# Patient Record
Sex: Female | Born: 1990 | Race: Black or African American | Hispanic: No | Marital: Single | Smoking: Never smoker
Health system: Southern US, Community
[De-identification: ages and names within clinical notes are randomized; demographics above are authoritative.]

## PROBLEM LIST (undated history)

## (undated) ENCOUNTER — Inpatient Hospital Stay (HOSPITAL_COMMUNITY): Payer: Self-pay

## (undated) DIAGNOSIS — F419 Anxiety disorder, unspecified: Secondary | ICD-10-CM

## (undated) DIAGNOSIS — I10 Essential (primary) hypertension: Secondary | ICD-10-CM

## (undated) DIAGNOSIS — O24419 Gestational diabetes mellitus in pregnancy, unspecified control: Secondary | ICD-10-CM

## (undated) DIAGNOSIS — O139 Gestational [pregnancy-induced] hypertension without significant proteinuria, unspecified trimester: Secondary | ICD-10-CM

---
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
## 2017-05-05 ENCOUNTER — Ambulatory Visit: Payer: Self-pay

## 2017-05-05 ENCOUNTER — Ambulatory Visit (INDEPENDENT_AMBULATORY_CARE_PROVIDER_SITE_OTHER): Payer: Self-pay | Admitting: General Practice

## 2017-05-05 ENCOUNTER — Encounter: Payer: Self-pay | Admitting: General Practice

## 2017-05-05 DIAGNOSIS — Z3491 Encounter for supervision of normal pregnancy, unspecified, first trimester: Secondary | ICD-10-CM

## 2017-05-05 DIAGNOSIS — O3680X0 Pregnancy with inconclusive fetal viability, not applicable or unspecified: Secondary | ICD-10-CM

## 2017-05-05 LAB — POCT PREGNANCY, URINE: PREG TEST UR: POSITIVE — AB

## 2017-05-05 NOTE — Progress Notes (Signed)
Patient here for upt today. UPT +. Patient reports first positive home test 3 days ago. LMP sometime end of September but unsure of date. Will do ultrasound to confirm dating/viability. Patient denies taking any meds or vitamins.  Single living IUP. Yolk sac visible CRL 2.36cm (2071w0d) EDD 12/08/2017. FHR 175bpm by m-mode Discussed dating with patient & encouraged patient to begin PNV & start OB care. Patient verbalized understanding.

## 2017-05-06 NOTE — Progress Notes (Signed)
Agree with nursing staff's documentation of this patient's clinic encounter.  Jaynie CollinsUgonna Anjana Cheek, MD 05/06/2017 4:19 PM

## 2017-07-01 ENCOUNTER — Other Ambulatory Visit (HOSPITAL_COMMUNITY)
Admission: RE | Admit: 2017-07-01 | Discharge: 2017-07-01 | Disposition: A | Discharge status: Home / Self Care | Payer: Medicaid Other | Source: Ambulatory Visit | Attending: Medical | Admitting: Medical

## 2017-07-01 ENCOUNTER — Ambulatory Visit (INDEPENDENT_AMBULATORY_CARE_PROVIDER_SITE_OTHER): Payer: Medicaid Other | Admitting: Medical

## 2017-07-01 ENCOUNTER — Encounter: Payer: Self-pay | Admitting: Medical

## 2017-07-01 VITALS — BP 157/100 | HR 90 | Ht 65.0 in | Wt 173.4 lb

## 2017-07-01 DIAGNOSIS — O0992 Supervision of high risk pregnancy, unspecified, second trimester: Secondary | ICD-10-CM

## 2017-07-01 DIAGNOSIS — Z3400 Encounter for supervision of normal first pregnancy, unspecified trimester: Secondary | ICD-10-CM | POA: Insufficient documentation

## 2017-07-01 DIAGNOSIS — O0991 Supervision of high risk pregnancy, unspecified, first trimester: Secondary | ICD-10-CM

## 2017-07-01 DIAGNOSIS — O10912 Unspecified pre-existing hypertension complicating pregnancy, second trimester: Secondary | ICD-10-CM

## 2017-07-01 DIAGNOSIS — Z3402 Encounter for supervision of normal first pregnancy, second trimester: Secondary | ICD-10-CM

## 2017-07-01 DIAGNOSIS — O10919 Unspecified pre-existing hypertension complicating pregnancy, unspecified trimester: Secondary | ICD-10-CM | POA: Insufficient documentation

## 2017-07-01 DIAGNOSIS — O0990 Supervision of high risk pregnancy, unspecified, unspecified trimester: Secondary | ICD-10-CM | POA: Insufficient documentation

## 2017-07-01 LAB — OB RESULTS CONSOLE GC/CHLAMYDIA: Gonorrhea: NEGATIVE

## 2017-07-01 LAB — POCT URINALYSIS DIP (DEVICE)
BILIRUBIN URINE: NEGATIVE
Glucose, UA: NEGATIVE mg/dL
HGB URINE DIPSTICK: NEGATIVE
KETONES UR: NEGATIVE mg/dL
Leukocytes, UA: NEGATIVE
Nitrite: NEGATIVE
PH: 8.5 — AB (ref 5.0–8.0)
PROTEIN: NEGATIVE mg/dL
SPECIFIC GRAVITY, URINE: 1.025 (ref 1.005–1.030)
Urobilinogen, UA: 0.2 mg/dL (ref 0.0–1.0)

## 2017-07-01 MED ORDER — LABETALOL HCL 200 MG PO TABS: 200.0000 mg | ORAL_TABLET | Freq: Two times a day (BID) | ORAL | 3 refills | Status: DC

## 2017-07-01 NOTE — Patient Instructions (Addendum)
Second Trimester of Pregnancy The second trimester is from week 13 through week 28, month 4 through 6. This is often the time in pregnancy that you feel your best. Often times, morning sickness has lessened or quit. You may have more energy, and you may get hungry more often. Your unborn baby (fetus) is growing rapidly. At the end of the sixth month, he or she is about 9 inches long and weighs about 1 pounds. You will likely feel the baby move (quickening) between 18 and 20 weeks of pregnancy. Follow these instructions at home:  Avoid all smoking, herbs, and alcohol. Avoid drugs not approved by your doctor.  Do not use any tobacco products, including cigarettes, chewing tobacco, and electronic cigarettes. If you need help quitting, ask your doctor. You may get counseling or other support to help you quit.  Only take medicine as told by your doctor. Some medicines are safe and some are not during pregnancy.  Exercise only as told by your doctor. Stop exercising if you start having cramps.  Eat regular, healthy meals.  Wear a good support bra if your breasts are tender.  Do not use hot tubs, steam rooms, or saunas.  Wear your seat belt when driving.  Avoid raw meat, uncooked cheese, and liter boxes and soil used by cats.  Take your prenatal vitamins.  Take 1500-2000 milligrams of calcium daily starting at the 20th week of pregnancy until you deliver your baby.  Try taking medicine that helps you poop (stool softener) as needed, and if your doctor approves. Eat more fiber by eating fresh fruit, vegetables, and whole grains. Drink enough fluids to keep your pee (urine) clear or pale yellow.  Take warm water baths (sitz baths) to soothe pain or discomfort caused by hemorrhoids. Use hemorrhoid cream if your doctor approves.  If you have puffy, bulging veins (varicose veins), wear support hose. Raise (elevate) your feet for 15 minutes, 3-4 times a day. Limit salt in your diet.  Avoid heavy  lifting, wear low heals, and sit up straight.  Rest with your legs raised if you have leg cramps or low back pain.  Visit your dentist if you have not gone during your pregnancy. Use a soft toothbrush to brush your teeth. Be gentle when you floss.  You can have sex (intercourse) unless your doctor tells you not to.  Go to your doctor visits. Get help if:  You feel dizzy.  You have mild cramps or pressure in your lower belly (abdomen).  You have a nagging pain in your belly area.  You continue to feel sick to your stomach (nauseous), throw up (vomit), or have watery poop (diarrhea).  You have bad smelling fluid coming from your vagina.  You have pain with peeing (urination). Get help right away if:  You have a fever.  You are leaking fluid from your vagina.  You have spotting or bleeding from your vagina.  You have severe belly cramping or pain.  You lose or gain weight rapidly.  You have trouble catching your breath and have chest pain.  You notice sudden or extreme puffiness (swelling) of your face, hands, ankles, feet, or legs.  You have not felt the baby move in over an hour.  You have severe headaches that do not go away with medicine.  You have vision changes. This information is not intended to replace advice given to you by your health care provider. Make sure you discuss any questions you have with your health care  provider. Document Released: 07/30/2009 Document Revised: 10/11/2015 Document Reviewed: 07/06/2012 Elsevier Interactive Patient Education  2017 [place] [address], [address] [place], [place]  [address] Phone - [phone]   Fax - [phone]  [facility] [address] [place], [place] [address] Phone - [phone]   Fax - [place] [address] [place], [place]  [address] Phone - [phone]   Fax -  [phone]  [place]. [address] [place] [address] [place], [place]  [address] Phone - [phone]   Fax - [phone]  [place] [address] [place], [place]  [address] Phone - [phone]   Fax - [phone]  [facility] [address] [place], [place]  [address] Phone - [phone]   Fax - [place] [address] [place], [place]  [address] Phone - [phone]   Fax - [phone]  [place] [address] [place], [place] [address] [place], [place]  [address] Phone - [phone]   Fax - [place] [address] [place], [place]  [address] Phone - [phone]   Fax - [phone] [facility] [place]. [address] [place], [place]  [address] Phone - [phone]   Fax - [phone]  [place] [address] [place], [place]  [address] Phone - [phone]   Fax - [phone]  [facility] [place], [place], [place]  [address] Phone - [phone]   Fax - [place] [address] [place], [place]  [address] Phone - [phone]   Fax - [phone]  [facility] [address], [place], [place]  [address] Phone - [place], [place]  [address] Phone - [phone]   Fax - [place] [address] [place], [place]  [address] Phone - [phone]   Fax - [phone]  [place] [address] [place], [place]  [address] Phone - [phone]   Fax - [place] [place], [place]  [address] Phone - [phone]   Fax - [place], [place], [place]   [address] Phone - [phone]   Fax - [place] [address] [place], [place]  [address] Phone - [phone]   Fax - [phone]  [place] [address], [place], [place]  [address] Phone - [phone]   Fax - [place] [address] [place], [place]  [address] Phone - [phone]   Fax - [phone]  [place] [address] [place], [place]  [address] Phone - [phone]   Fax - [phone] [place] [address] [place], [place]  [address] Phone - [phone]   Fax - [place] [address] [place], [place]  [address] Phone - [phone]   Fax - [place] [address] [place],   [address] Phone - [phone]   Fax - [phone]  [place] MD [address] [place] [address] Phone  [phone]  Fax [phone]  Childbirth Education Options: [facility] Department Classes:  Childbirth education classes can help you get ready for a positive parenting experience. You can also meet other expectant parents and get free stuff for your baby. Each class runs for five weeks on the same night and costs $45 for the mother-to-be and her support person. Medicaid covers the cost if you are eligible. Call [phone] to register. [facility] Childbirth Education:  [phone] or [phone] or [email or web address]  Baby & Me Class: Discuss newborn & infant parenting and family adjustment issues with other new mothers in a relaxed environment. Each week brings a new speaker or baby-centered activity. We encourage new mothers to join [place] every Thursday at 11:00am. Babies birth until crawling. No registration or fee. Daddy [employer]: This course offers Dads-to-be the tools and  knowledge needed to feel confident on their journey to becoming new fathers. Experienced dads, who have been trained as coaches, teach dads-to-be how to hold, comfort, diaper, swaddle and play with their infant while being able to support the new mom as well. A class for men taught by men. $25/dad Big Brother/Big Sister: Let your children share in the joy of a new brother or sister in this special class designed just for them. Class includes discussion about how families care for babies: swaddling, holding, diapering, safety as well as how they can be helpful in their new role. This class is designed for children ages 49 to 76, but any age is welcome. Please register each child individually. $5/child  Mom Talk: This mom-led group offers support and connection to mothers as they journey through the adjustments and struggles of that sometimes overwhelming first year after the birth of a child. Tuesdays at 10:00am and Thursdays at 6:00pm. Babies welcome. No registration or fee. Breastfeeding Support Group: This group is a mother-to-mother support circle where moms have the opportunity to share their breastfeeding experiences. A Lactation Consultant is present for questions and concerns. Meets each Tuesday at 11:00am. No fee or registration. Breastfeeding Your Baby: Learn what to expect in the first days of breastfeeding your newborn.  This class will help you feel more confident with the skills needed to begin your breastfeeding experience. Many new mothers are concerned about breastfeeding after leaving the hospital. This class will also address the most common fears and challenges about breastfeeding during the first few weeks, months and beyond. (call for fee) Comfort Techniques and Tour: This 2 hour interactive class will provide you the opportunity to learn & practice hands-on techniques that can help relieve some of the discomfort of labor and encourage your baby to rotate toward the best position for birth.  You and your partner will be able to try a variety of labor positions with birth balls and rebozos as well as practice breathing, relaxation, and visualization techniques. A tour of the [facility] is included with this class. $20 per registrant and support person Childbirth Class- Weekend Option: This class is a Weekend version of our Birth & Baby series. It is designed for parents who have a difficult time fitting several weeks of classes into their schedule. It covers the care of your newborn and the basics of labor and childbirth. It also includes a [facility] and lunch. The class is held two consecutive days: beginning on Friday evening from 6:30 - 8:30 p.m. and the next day, Saturday from 9 a.m. - 4 p.m. (call for fee) Doren Custard Class:  Interested in a waterbirth?  This informational class will help you discover whether waterbirth is the right fit for you. Education about waterbirth itself, supplies you would need and how to assemble your support team is what you can expect from this class. Some obstetrical practices require this class in order to pursue a waterbirth. (Not all obstetrical practices offer waterbirth-check with your healthcare provider.) Register only the expectant mom, but you are encouraged to bring your partner to class! Required if planning waterbirth, no fee. Infant/Child CPR: Parents, grandparents, babysitters, and friends learn Cardio-Pulmonary Resuscitation skills for infants and children. You will also learn how to treat both conscious and unconscious choking in infants and children. This Family & Friends program does not offer certification. Register each participant individually to ensure that enough mannequins are available. (Call for fee) Grandparent Love: Expecting a grandbaby? This class is for you! Learn about the latest infant care and safety recommendations and ways to support your own child as he or she  transitions into the parenting role. Taught by Registered Nurses who are childbirth instructors, but most importantly...they are grandmothers too! $10/person. Childbirth Class- Natural Childbirth: This series of 5 weekly classes is for expectant parents who want to learn and practice natural methods of coping with the process of labor and childbirth. Relaxation, breathing, massage, visualization, role of the partner, and helpful positioning are highlighted. Participants learn how to be confident in their body's ability to give birth. This class will empower and help parents make informed decisions about their own care. Includes discussion that will help new parents transition into the immediate postpartum period. [facility] is included. We suggest taking this class between 25-32 weeks, but it's only a recommendation. $75 per registrant and one support person or $30 Medicaid. Childbirth Class- 3 week Series: This option of 3 weekly classes helps you and your labor partner prepare for childbirth. Newborn care, labor & birth, cesarean birth, pain management, and comfort techniques are discussed and a [place] [facility] is included. The class meets at the same time, on the same day of the week for 3 consecutive weeks beginning with the starting date you choose. $60 for registrant and one support person.  Marvelous Multiples: Expecting twins, triplets, or more? This class covers the differences in labor, birth, parenting, and breastfeeding issues that face multiples' parents. NICU tour is included. Led by a Certified Childbirth Educator who is the mother of twins. No fee. Caring for Baby: This class is for expectant and adoptive parents who want to learn and practice the most up-to-date newborn care for their babies. Focus is on birth through the first six weeks of life. Topics include feeding, bathing, diapering, crying, umbilical cord care,  circumcision care and safe sleep. Parents learn to recognize symptoms of illness and when to call the pediatrician. Register only the mom-to-be and your partner or support person can plan to come with you! $10 per registrant and support person Childbirth Class- online option: This online class offers you the freedom to complete a Birth and Baby series in the comfort of your own home. The flexibility of this option allows you to review sections at your own pace, at times convenient to you and your support people. It includes additional video information, animations, quizzes, and extended activities. Get organized with helpful eClass tools, checklists, and trackers. Once you register online for the class, you will receive an email within a few days to accept the invitation and  begin the class when the time is right for you. The content will be available to you for 60 days. $60 for 60 days of online access for you and your support people.  Local Doulas: Natural Baby Doulas [email or web address] Tel: [phone] [email or web address] [employer] [phone] [email or web address] The Labor Hassell Halim  (also do waterbirth tub rental) ([phone] [email or web address] Triad Birth Doula [phone] [email or web address] NotebookDistributors.fi [facility] Rhythms  [phone] https://[email or web address] [employer] (PADA) [email or web address]://[email or web address]/ [place] Birth and Baby  http://[email or web address] Considering Waterbirth? Guide for patients at [facility] [employer]  Why consider waterbirth?  . Gentle birth for babies . Less pain medicine used in labor . May allow for passive descent/less pushing . May reduce perineal tears  . More mobility and instinctive maternal position changes . Increased maternal relaxation . Reduced blood  pressure in labor  Is waterbirth safe? What are the risks of infection, drowning or other complications?  . Infection: o Very low risk (3.7 % for tub vs 4.8% for bed) o 7 in 8000 waterbirths with documented infection o Poorly cleaned equipment most common cause o Slightly lower group B strep transmission rate  . Drowning o Maternal:  - Very low risk   - Related to seizures or fainting o Newborn:  - Very low risk. No evidence of increased risk of respiratory problems in multiple large studies - Physiological protection from breathing under water - Avoid underwater birth if there are any fetal complications - Once baby's head is out of the water, keep it out.  . Birth complication o Some reports of cord trauma, but risk decreased by bringing baby to surface gradually o No evidence of increased risk of shoulder dystocia. Mothers can usually change positions faster in water than in a bed, possibly aiding the maneuvers to free the shoulder.   You must attend a Doren Custard class at [facility]  3rd Wednesday of every month from 7-9pm  [employer] by calling [phone] or online at [email or web address]  Bring [place] the certificate from the class to your prenatal appointment  Meet with a midwife at 36 weeks to see if you can still plan a waterbirth and to sign the consent.   Purchase or rent the following supplies:   Water Birth Pool (Birth Pool in a Box or Antler for instance)  (Tubs start ~$125)  Single-use disposable tub liner designed for your brand of tub  New garden hose labeled "lead-free", "suitable for drinking water",  Electric drain pump to remove water (We recommend 792 gallon per hour or greater pump.)   Separate garden hose to remove the dirty water  Fish net  Bathing suit top (optional)  Long-handled mirror (optional)  Places to purchase or rent supplies  GotWebTools.is for tub purchases and supplies  Waterbirthsolutions.com for tub  purchases and supplies  The Labor Ladies (www.[email or web address]) $275 for tub rental/set-up & take down/kit   [employer] (http://www.fleming.com/.htm) Information regarding doulas (labor support) who provide pool rentals  Our practice has a Birth Pool in a Box tub at the hospital that you may borrow on a first-come-first-served basis. It is your responsibility to to set up, clean and break down the tub. We cannot guarantee the availability of this tub in advance. You are responsible for bringing all accessories listed above. If you do not have all necessary supplies you cannot have a waterbirth.    Things that would prevent you from having a waterbirth:  Premature, <37wks  Previous cesarean birth  Presence of thick  meconium-stained fluid  Multiple gestation (Twins, triplets, etc.)  Uncontrolled diabetes or gestational diabetes requiring medication  Hypertension requiring medication or diagnosis of pre-eclampsia  Heavy vaginal bleeding  Non-reassuring fetal heart rate  Active infection (MRSA, etc.). Group B Strep is NOT a contraindication for  waterbirth.  If your labor has to be induced and induction method requires continuous  monitoring of the baby's heart rate  Other risks/issues identified by your obstetrical provider  Please remember that birth is unpredictable. Under certain unforeseeable circumstances your provider may advise against giving birth in the tub. These decisions will be made on a case-by-case basis and with the safety of you and your baby as our highest priority.

## 2017-07-02 ENCOUNTER — Encounter: Payer: Self-pay | Admitting: *Deleted

## 2017-07-02 LAB — PROTEIN / CREATININE RATIO, URINE
Creatinine, Urine: 136.9 mg/dL
PROTEIN UR: 14.4 mg/dL
Protein/Creat Ratio: 105 mg/g creat (ref 0–200)

## 2017-07-02 LAB — GC/CHLAMYDIA PROBE AMP (~~LOC~~) NOT AT ARMC
Chlamydia: NEGATIVE
Neisseria Gonorrhea: NEGATIVE

## 2017-07-03 LAB — COMPREHENSIVE METABOLIC PANEL
A/G RATIO: 1.3 (ref 1.2–2.2)
ALT: 12 IU/L (ref 0–32)
AST: 10 IU/L (ref 0–40)
Albumin: 3.9 g/dL (ref 3.5–5.5)
Alkaline Phosphatase: 68 IU/L (ref 39–117)
BILIRUBIN TOTAL: 0.3 mg/dL (ref 0.0–1.2)
BUN/Creatinine Ratio: 11 (ref 9–23)
BUN: 6 mg/dL (ref 6–20)
CALCIUM: 9.1 mg/dL (ref 8.7–10.2)
CO2: 21 mmol/L (ref 20–29)
Chloride: 104 mmol/L (ref 96–106)
Creatinine, Ser: 0.56 mg/dL — ABNORMAL LOW (ref 0.57–1.00)
GFR, EST AFRICAN AMERICAN: 148 mL/min/{1.73_m2} (ref >59)
GFR, EST NON AFRICAN AMERICAN: 128 mL/min/{1.73_m2} (ref >59)
GLOBULIN, TOTAL: 3 g/dL (ref 1.5–4.5)
Glucose: 79 mg/dL (ref 65–99)
POTASSIUM: 3.9 mmol/L (ref 3.5–5.2)
SODIUM: 140 mmol/L (ref 134–144)
Total Protein: 6.9 g/dL (ref 6.0–8.5)

## 2017-07-03 LAB — OBSTETRIC PANEL, INCLUDING HIV
ANTIBODY SCREEN: NEGATIVE
BASOS: 1 %
Basophils Absolute: 0 10*3/uL (ref 0.0–0.2)
EOS (ABSOLUTE): 0.1 10*3/uL (ref 0.0–0.4)
EOS: 2 %
HEMATOCRIT: 35.4 % (ref 34.0–46.6)
HEMOGLOBIN: 11.9 g/dL (ref 11.1–15.9)
HIV SCREEN 4TH GENERATION: NONREACTIVE
Hepatitis B Surface Ag: NEGATIVE
IMMATURE GRANS (ABS): 0.1 10*3/uL (ref 0.0–0.1)
Immature Granulocytes: 1 %
LYMPHS: 28 %
Lymphocytes Absolute: 2.3 10*3/uL (ref 0.7–3.1)
MCH: 31.2 pg (ref 26.6–33.0)
MCHC: 33.6 g/dL (ref 31.5–35.7)
MCV: 93 fL (ref 79–97)
MONOCYTES: 10 %
Monocytes Absolute: 0.8 10*3/uL (ref 0.1–0.9)
NEUTROS ABS: 4.9 10*3/uL (ref 1.4–7.0)
Neutrophils: 58 %
Platelets: 283 10*3/uL (ref 150–379)
RBC: 3.82 x10E6/uL (ref 3.77–5.28)
RDW: 14.3 % (ref 12.3–15.4)
RH TYPE: POSITIVE
RPR Ser Ql: NONREACTIVE
RUBELLA: 1.38 {index} (ref >0.99)
WBC: 8.1 10*3/uL (ref 3.4–10.8)

## 2017-07-03 LAB — HEMOGLOBINOPATHY EVALUATION
Ferritin: 33 ng/mL (ref 15–150)
HGB A2 QUANT: 1.9 % (ref 1.8–3.2)
HGB A: 98.1 % (ref 96.4–98.8)
HGB C: 0 %
HGB F QUANT: 0 % (ref 0.0–2.0)
HGB S: 0 %
Hgb Solubility: NEGATIVE
Hgb Variant: 0 %

## 2017-07-03 LAB — CULTURE, OB URINE

## 2017-07-03 LAB — URINE CULTURE, OB REFLEX

## 2017-07-03 NOTE — Progress Notes (Signed)
PRENATAL VISIT NOTE  Subjective:  Amanda Meza is a 27 y.o. G1P0 at 25w3dbeing seen today for her first prenatal care visit.  She is currently monitored for the following issues for this high-risk pregnancy and has Supervision of high risk pregnancy, antepartum, first trimester and Chronic hypertension during pregnancy, antepartum on their problem list.  Patient reports no complaints.  Contractions: Not present. Vag. Bleeding: None.  Movement: Absent. Denies leaking of fluid.   The following portions of the patient's history were reviewed and updated as appropriate: allergies, current medications, past family history, past medical history, past social history, past surgical history and problem list. Problem list updated.  Objective:   Vitals:   07/01/17 1047 07/01/17 1048 07/01/17 1056  BP: (!) 178/92  (!) 157/100  Pulse: 85  90  Weight: 173 lb 6.4 oz (78.7 kg)    Height:  5' 5" (1.651 m)     Fetal Status: Fetal Heart Rate (bpm): 147   Movement: Absent     General:  Alert, oriented and cooperative. Patient is in no acute distress.  Skin: Skin is warm and dry. No rash noted.   Cardiovascular: Normal heart rate and rhythm noted  Respiratory: Normal respiratory effort, no problems with respiration noted. Clear to auscultation.   Abdomen: Normal bowel sounds. Soft, gravid, appropriate for gestational age.  Pain/Pressure: Absent     Pelvic: Cervical exam deferred       patient refused pap smear or pelvic exam today Breast: symmetric, without masses or discharge from nipples  Extremities: Normal range of motion.  Edema: None  Mental Status:  Normal mood and affect. Normal behavior. Normal judgment and thought content.   Assessment and Plan:  Pregnancy: G1P0 at 18w3d1. Supervision of high risk pregnancy, antepartum, first trimester - [place] OB DETAIL +14 WK; scheduled - SMN1 Copy Number Analysis - Enroll Patient in Babyscripts - Obstetric Panel, Including HIV -  Culture, OB Urine - Hemoglobinopathy Evaluation - GC/Chlamydia probe amp ([place])not at ARMunday3. Chronic hypertension during pregnancy, antepartum - Patient denies previous diagnosis, but with significant elevated BP today x 2 at 17 weeks, likely CHTN - Baseline labs drawn - Comp Met (CMET) - Protein / creatinine ratio, urine - Discussed patient with Dr. AnHarolyn Rutherfordagrees with plan to start Labetalol  - labetalol (NORMODYNE) 200 MG tablet; Take 1 tablet (200 mg total) by mouth 2 (two) times daily.  Dispense: 60 tablet; Refill: 3 - Discussed warning signs for worsening HTN and risk of uncontrolled HTN for her health and pregnancy  Second trimester warning symptoms and general obstetric precautions including but not limited to vaginal bleeding, contractions, leaking of fluid and fetal movement were reviewed in detail with the patient. Please refer to After Visit Summary for other counseling recommendations.  [facility] about 2 weeks (around 07/15/2017) [facility]  JuKerry HoughPA-C

## 2017-07-08 ENCOUNTER — Encounter: Payer: Self-pay | Admitting: *Deleted

## 2017-07-08 ENCOUNTER — Encounter (HOSPITAL_COMMUNITY): Payer: Self-pay | Admitting: Medical

## 2017-07-08 ENCOUNTER — Telehealth: Payer: Self-pay | Admitting: Obstetrics & Gynecology

## 2017-07-08 NOTE — Telephone Encounter (Signed)
Patient is calling for results

## 2017-07-10 LAB — SMN1 COPY NUMBER ANALYSIS (SMA CARRIER SCREENING)

## 2017-07-15 ENCOUNTER — Encounter (HOSPITAL_COMMUNITY): Payer: Self-pay

## 2017-07-15 ENCOUNTER — Ambulatory Visit (HOSPITAL_COMMUNITY)
Admission: RE | Admit: 2017-07-15 | Discharge: 2017-07-15 | Disposition: A | Discharge status: Home / Self Care | Payer: Medicaid Other | Source: Ambulatory Visit | Attending: Medical | Admitting: Medical

## 2017-07-15 DIAGNOSIS — Z3A19 19 weeks gestation of pregnancy: Secondary | ICD-10-CM | POA: Insufficient documentation

## 2017-07-15 DIAGNOSIS — O10912 Unspecified pre-existing hypertension complicating pregnancy, second trimester: Secondary | ICD-10-CM | POA: Diagnosis present

## 2017-07-15 DIAGNOSIS — O10919 Unspecified pre-existing hypertension complicating pregnancy, unspecified trimester: Secondary | ICD-10-CM

## 2017-07-15 DIAGNOSIS — O0992 Supervision of high risk pregnancy, unspecified, second trimester: Secondary | ICD-10-CM | POA: Insufficient documentation

## 2017-07-15 DIAGNOSIS — O0991 Supervision of high risk pregnancy, unspecified, first trimester: Secondary | ICD-10-CM

## 2017-07-15 HISTORY — DX: Essential (primary) hypertension: I10

## 2017-07-15 MED ORDER — LABETALOL HCL 300 MG PO TABS: 300.0000 mg | ORAL_TABLET | Freq: Two times a day (BID) | ORAL | 1 refills | Status: DC

## 2017-07-15 MED ORDER — ASPIRIN EC 81 MG PO TBEC: 81.0000 mg | DELAYED_RELEASE_TABLET | Freq: Every day | ORAL | 5 refills | Status: DC

## 2017-07-15 MED ORDER — BLOOD PRESSURE CUFF MISC: 0 refills | Status: DC

## 2017-07-15 NOTE — Progress Notes (Signed)
Maternal Fetal Care ultrasound  Indication: [age] yr old G1P0 at 1528w1d with chronic hypertension for fetal anatomic survey.  Findings: 1. Single intrauterine pregnancy with cardiac activity. 2. Fetal biometry is consistent with dating. 3. Posterior placenta without evidence of previa. 4. Normal amniotic fluid volume. 5. Normal transabdominal cervical length. 6. No adnexal masses seen. 7. Fetus is in cephalic presentation. 8. Normal fetal anatomic survey.  Recommendations: 1. Appropriate fetal growth. 2. Normal fetal anatomic survey. 3. Low risk cell free fetal DNA. 4. Hypertension: - on labetalol - severe range BPs today - recommend increase labetalol to 300mg  bid (prescription sent to pharmacy) - recommend obtain home BP cuff and check 2-3x/day (prescription sent to pharmacy) - recommend adjust medications to keep BPs <150/100 - recommend start low dose aspirin 81mg /day to reduce risk of preeclampsia (prescription sent to pharmacy) - recommend fetal growth every 4 weeks - recommend start antenatal testing at 32 weeks - recommend close surveillance for the development of signs/symptoms of preeclampsia  Amanda FosterKristen Man Bonneau, MD

## 2017-07-15 NOTE — Telephone Encounter (Signed)
Called patient, no answer- left message stating your most recent test results were normal. You may call us back if you have questions

## 2017-07-15 NOTE — ED Notes (Signed)
Prescription for labetalol, aspirin and a blood pressure cuff [facility] [address] Per Dr. Vincenza HewsQuinn.

## 2017-07-16 ENCOUNTER — Ambulatory Visit (INDEPENDENT_AMBULATORY_CARE_PROVIDER_SITE_OTHER): Payer: Medicaid Other | Admitting: Obstetrics & Gynecology

## 2017-07-16 ENCOUNTER — Other Ambulatory Visit (HOSPITAL_COMMUNITY): Payer: Self-pay | Admitting: *Deleted

## 2017-07-16 VITALS — BP 146/90 | HR 79 | Wt 174.9 lb

## 2017-07-16 DIAGNOSIS — O0991 Supervision of high risk pregnancy, unspecified, first trimester: Secondary | ICD-10-CM

## 2017-07-16 DIAGNOSIS — O10919 Unspecified pre-existing hypertension complicating pregnancy, unspecified trimester: Secondary | ICD-10-CM

## 2017-07-16 DIAGNOSIS — O10912 Unspecified pre-existing hypertension complicating pregnancy, second trimester: Secondary | ICD-10-CM

## 2017-07-16 DIAGNOSIS — O0992 Supervision of high risk pregnancy, unspecified, second trimester: Secondary | ICD-10-CM

## 2017-07-16 LAB — POCT URINALYSIS DIP (DEVICE)
BILIRUBIN URINE: NEGATIVE
Glucose, UA: NEGATIVE mg/dL
HGB URINE DIPSTICK: NEGATIVE
KETONES UR: NEGATIVE mg/dL
LEUKOCYTES UA: NEGATIVE
NITRITE: NEGATIVE
PH: 5.5 (ref 5.0–8.0)
Protein, ur: NEGATIVE mg/dL
SPECIFIC GRAVITY, URINE: 1.02 (ref 1.005–1.030)
Urobilinogen, UA: 0.2 mg/dL (ref 0.0–1.0)

## 2017-07-16 NOTE — Progress Notes (Signed)
   PRENATAL VISIT NOTE  Subjective:  Amanda Meza is a 27 y.o. G1P0 at 7483w2d being seen today for ongoing prenatal care.  She is currently monitored for the following issues for this high-risk pregnancy and has Supervision of high risk pregnancy, antepartum, first trimester and Chronic hypertension during pregnancy, antepartum on their problem list.  Patient reports no complaints.  Contractions: Not present. Vag. Bleeding: None.  Movement: Present. Denies leaking of fluid.   The following portions of the patient's history were reviewed and updated as appropriate: allergies, current medications, past family history, past medical history, past social history, past surgical history and problem list. Problem list updated.  Objective:   Vitals:   07/16/17 0816 07/16/17 0818  BP: (!) 158/95 (!) 146/90  Pulse: 84 79  Weight: 174 lb 14.4 oz (79.3 kg)     Fetal Status: Fetal Heart Rate (bpm): 145   Movement: Present     General:  Alert, oriented and cooperative. Patient is in no acute distress.  Skin: Skin is warm and dry. No rash noted.   Cardiovascular: Normal heart rate noted  Respiratory: Normal respiratory effort, no problems with respiration noted  Abdomen: Soft, gravid, appropriate for gestational age.  Pain/Pressure: Absent     Pelvic: Cervical exam deferred        Extremities: Normal range of motion.  Edema: None  Mental Status:  Normal mood and affect. Normal behavior. Normal judgment and thought content.   Assessment and Plan:  Pregnancy: G1P0 at 5583w2d  There are no diagnoses linked to this encounter. Preterm labor symptoms and general obstetric precautions including but not limited to vaginal bleeding, contractions, leaking of fluid and fetal movement were reviewed in detail with the patient. Please refer to After Visit Summary for other counseling recommendations.  Return in about 2 weeks (around 07/30/2017).   Scheryl DarterJames Tamani Durney, MD

## 2017-07-16 NOTE — Patient Instructions (Signed)
Second Trimester of Pregnancy The second trimester is from week 13 through week 28, month 4 through 6. This is often the time in pregnancy that you feel your best. Often times, morning sickness has lessened or quit. You may have more energy, and you may get hungry more often. Your unborn baby (fetus) is growing rapidly. At the end of the sixth month, he or she is about 9 inches long and weighs about 1 pounds. You will likely feel the baby move (quickening) between 18 and 20 weeks of pregnancy. Follow these instructions at home:  Avoid all smoking, herbs, and alcohol. Avoid drugs not approved by your doctor.  Do not use any tobacco products, including cigarettes, chewing tobacco, and electronic cigarettes. If you need help quitting, ask your doctor. You may get counseling or other support to help you quit.  Only take medicine as told by your doctor. Some medicines are safe and some are not during pregnancy.  Exercise only as told by your doctor. Stop exercising if you start having cramps.  Eat regular, healthy meals.  Wear a good support bra if your breasts are tender.  Do not use hot tubs, steam rooms, or saunas.  Wear your seat belt when driving.  Avoid raw meat, uncooked cheese, and liter boxes and soil used by cats.  Take your prenatal vitamins.  Take 1500-2000 milligrams of calcium daily starting at the 20th week of pregnancy until you deliver your baby.  Try taking medicine that helps you poop (stool softener) as needed, and if your doctor approves. Eat more fiber by eating fresh fruit, vegetables, and whole grains. Drink enough fluids to keep your pee (urine) clear or pale yellow.  Take warm water baths (sitz baths) to soothe pain or discomfort caused by hemorrhoids. Use hemorrhoid cream if your doctor approves.  If you have puffy, bulging veins (varicose veins), wear support hose. Raise (elevate) your feet for 15 minutes, 3-4 times a day. Limit salt in your diet.  Avoid heavy  lifting, wear low heals, and sit up straight.  Rest with your legs raised if you have leg cramps or low back pain.  Visit your dentist if you have not gone during your pregnancy. Use a soft toothbrush to brush your teeth. Be gentle when you floss.  You can have sex (intercourse) unless your doctor tells you not to.  Go to your doctor visits. Get help if:  You feel dizzy.  You have mild cramps or pressure in your lower belly (abdomen).  You have a nagging pain in your belly area.  You continue to feel sick to your stomach (nauseous), throw up (vomit), or have watery poop (diarrhea).  You have bad smelling fluid coming from your vagina.  You have pain with peeing (urination). Get help right away if:  You have a fever.  You are leaking fluid from your vagina.  You have spotting or bleeding from your vagina.  You have severe belly cramping or pain.  You lose or gain weight rapidly.  You have trouble catching your breath and have chest pain.  You notice sudden or extreme puffiness (swelling) of your face, hands, ankles, feet, or legs.  You have not felt the baby move in over an hour.  You have severe headaches that do not go away with medicine.  You have vision changes. This information is not intended to replace advice given to you by your health care provider. Make sure you discuss any questions you have with your health care   provider. Document Released: 07/30/2009 Document Revised: 10/11/2015 Document Reviewed: 07/06/2012 Elsevier Interactive Patient Education  2017 [employer]  

## 2017-07-19 LAB — AFP, SERUM, OPEN SPINA BIFIDA
AFP MOM: 0.61
AFP Value: 30 ng/mL
GEST. AGE ON COLLECTION DATE: 19.2 wk
Maternal Age At EDD: 27.5 yr
OSBR RISK 1 IN: 10000
TEST RESULTS AFP: NEGATIVE
Weight: 174 [lb_av]

## 2017-07-30 ENCOUNTER — Ambulatory Visit (INDEPENDENT_AMBULATORY_CARE_PROVIDER_SITE_OTHER): Payer: Medicaid Other | Admitting: Obstetrics & Gynecology

## 2017-07-30 ENCOUNTER — Other Ambulatory Visit (HOSPITAL_COMMUNITY)
Admission: RE | Admit: 2017-07-30 | Discharge: 2017-07-30 | Disposition: A | Discharge status: Home / Self Care | Payer: Medicaid Other | Source: Ambulatory Visit | Attending: Obstetrics & Gynecology | Admitting: Obstetrics & Gynecology

## 2017-07-30 VITALS — BP 163/98 | HR 80 | Wt 176.4 lb

## 2017-07-30 DIAGNOSIS — O0990 Supervision of high risk pregnancy, unspecified, unspecified trimester: Secondary | ICD-10-CM | POA: Diagnosis present

## 2017-07-30 DIAGNOSIS — O10919 Unspecified pre-existing hypertension complicating pregnancy, unspecified trimester: Secondary | ICD-10-CM | POA: Diagnosis not present

## 2017-07-30 DIAGNOSIS — Z3A00 Weeks of gestation of pregnancy not specified: Secondary | ICD-10-CM | POA: Diagnosis not present

## 2017-07-30 MED ORDER — LABETALOL HCL 200 MG PO TABS: 400.0000 mg | ORAL_TABLET | Freq: Two times a day (BID) | ORAL | 2 refills | Status: DC

## 2017-07-30 NOTE — Progress Notes (Signed)
   PRENATAL VISIT NOTE  Subjective:  Amanda Meza is a 27 y.o. G1P0 at 3543w2d being seen today for ongoing prenatal care.  She is currently monitored for the following issues for this high-risk pregnancy and has Supervision of high risk pregnancy, antepartum and Chronic hypertension during pregnancy, antepartum on their problem list.  Patient reports nausea.   . Vag. Bleeding: None.  Movement: Present. Denies leaking of fluid.   The following portions of the patient's history were reviewed and updated as appropriate: allergies, current medications, past family history, past medical history, past social history, past surgical history and problem list. Problem list updated.  Objective:   Vitals:   07/30/17 1127 07/30/17 1137  BP: (!) 169/95 (!) 163/98  Pulse: 92 80  Weight: 176 lb 6.4 oz (80 kg)     Fetal Status: Fetal Heart Rate (bpm): 146   Movement: Present     General:  Alert, oriented and cooperative. Patient is in no acute distress.  Skin: Skin is warm and dry. No rash noted.   Cardiovascular: Normal heart rate noted  Respiratory: Normal respiratory effort, no problems with respiration noted  Abdomen: Soft, gravid, appropriate for gestational age.  Pain/Pressure: Absent     Pelvic: Cervical exam performed      speculum exam, pap done  Extremities: Normal range of motion.  Edema: None  Mental Status:  Normal mood and affect. Normal behavior. Normal judgment and thought content.   Assessment and Plan:  Pregnancy: G1P0 at 4943w2d  1. Supervision of high risk pregnancy, antepartum  - Cytology - PAP  2. Chronic hypertension during pregnancy, antepartum Increase labetalol for BP control - labetalol (NORMODYNE) 200 MG tablet; Take 2 tablets (400 mg total) by mouth 2 (two) times daily.  Dispense: 120 tablet; Refill: 2 - Cytology - PAP  Preterm labor symptoms and general obstetric precautions including but not limited to vaginal bleeding, contractions, leaking of fluid and  fetal movement were reviewed in detail with the patient. Please refer to After Visit Summary for other counseling recommendations.  Return in about 2 weeks (around 08/13/2017).   Scheryl DarterJames Kearstyn Avitia, MD

## 2017-07-30 NOTE — Patient Instructions (Signed)
Second Trimester of Pregnancy The second trimester is from week 13 through week 28, month 4 through 6. This is often the time in pregnancy that you feel your best. Often times, morning sickness has lessened or quit. You may have more energy, and you may get hungry more often. Your unborn baby (fetus) is growing rapidly. At the end of the sixth month, he or she is about 9 inches long and weighs about 1 pounds. You will likely feel the baby move (quickening) between 18 and 20 weeks of pregnancy. Follow these instructions at home:  Avoid all smoking, herbs, and alcohol. Avoid drugs not approved by your doctor.  Do not use any tobacco products, including cigarettes, chewing tobacco, and electronic cigarettes. If you need help quitting, ask your doctor. You may get counseling or other support to help you quit.  Only take medicine as told by your doctor. Some medicines are safe and some are not during pregnancy.  Exercise only as told by your doctor. Stop exercising if you start having cramps.  Eat regular, healthy meals.  Wear a good support bra if your breasts are tender.  Do not use hot tubs, steam rooms, or saunas.  Wear your seat belt when driving.  Avoid raw meat, uncooked cheese, and liter boxes and soil used by cats.  Take your prenatal vitamins.  Take 1500-2000 milligrams of calcium daily starting at the 20th week of pregnancy until you deliver your baby.  Try taking medicine that helps you poop (stool softener) as needed, and if your doctor approves. Eat more fiber by eating fresh fruit, vegetables, and whole grains. Drink enough fluids to keep your pee (urine) clear or pale yellow.  Take warm water baths (sitz baths) to soothe pain or discomfort caused by hemorrhoids. Use hemorrhoid cream if your doctor approves.  If you have puffy, bulging veins (varicose veins), wear support hose. Raise (elevate) your feet for 15 minutes, 3-4 times a day. Limit salt in your diet.  Avoid heavy  lifting, wear low heals, and sit up straight.  Rest with your legs raised if you have leg cramps or low back pain.  Visit your dentist if you have not gone during your pregnancy. Use a soft toothbrush to brush your teeth. Be gentle when you floss.  You can have sex (intercourse) unless your doctor tells you not to.  Go to your doctor visits. Get help if:  You feel dizzy.  You have mild cramps or pressure in your lower belly (abdomen).  You have a nagging pain in your belly area.  You continue to feel sick to your stomach (nauseous), throw up (vomit), or have watery poop (diarrhea).  You have bad smelling fluid coming from your vagina.  You have pain with peeing (urination). Get help right away if:  You have a fever.  You are leaking fluid from your vagina.  You have spotting or bleeding from your vagina.  You have severe belly cramping or pain.  You lose or gain weight rapidly.  You have trouble catching your breath and have chest pain.  You notice sudden or extreme puffiness (swelling) of your face, hands, ankles, feet, or legs.  You have not felt the baby move in over an hour.  You have severe headaches that do not go away with medicine.  You have vision changes. This information is not intended to replace advice given to you by your health care provider. Make sure you discuss any questions you have with your health care   provider. Document Released: 07/30/2009 Document Revised: 10/11/2015 Document Reviewed: 07/06/2012 Elsevier Interactive Patient Education  2017 [employer]  

## 2017-07-31 LAB — CYTOLOGY - PAP: Diagnosis: NEGATIVE

## 2017-08-19 ENCOUNTER — Other Ambulatory Visit (HOSPITAL_COMMUNITY): Payer: Self-pay | Admitting: Obstetrics and Gynecology

## 2017-08-19 ENCOUNTER — Encounter (HOSPITAL_COMMUNITY): Payer: Self-pay

## 2017-08-19 ENCOUNTER — Ambulatory Visit (HOSPITAL_COMMUNITY)
Admission: RE | Admit: 2017-08-19 | Discharge: 2017-08-19 | Disposition: A | Discharge status: Home / Self Care | Payer: Medicaid Other | Source: Ambulatory Visit | Attending: Obstetrics & Gynecology | Admitting: Obstetrics & Gynecology

## 2017-08-19 ENCOUNTER — Other Ambulatory Visit (HOSPITAL_COMMUNITY): Payer: Self-pay | Admitting: *Deleted

## 2017-08-19 ENCOUNTER — Ambulatory Visit (INDEPENDENT_AMBULATORY_CARE_PROVIDER_SITE_OTHER): Payer: Medicaid Other | Admitting: Obstetrics and Gynecology

## 2017-08-19 VITALS — BP 153/100 | HR 76 | Wt 175.8 lb

## 2017-08-19 DIAGNOSIS — Z364 Encounter for antenatal screening for fetal growth retardation: Secondary | ICD-10-CM | POA: Insufficient documentation

## 2017-08-19 DIAGNOSIS — O10012 Pre-existing essential hypertension complicating pregnancy, second trimester: Secondary | ICD-10-CM | POA: Insufficient documentation

## 2017-08-19 DIAGNOSIS — O10919 Unspecified pre-existing hypertension complicating pregnancy, unspecified trimester: Secondary | ICD-10-CM

## 2017-08-19 DIAGNOSIS — O10912 Unspecified pre-existing hypertension complicating pregnancy, second trimester: Secondary | ICD-10-CM

## 2017-08-19 DIAGNOSIS — O0992 Supervision of high risk pregnancy, unspecified, second trimester: Secondary | ICD-10-CM

## 2017-08-19 DIAGNOSIS — Z3A24 24 weeks gestation of pregnancy: Secondary | ICD-10-CM | POA: Diagnosis not present

## 2017-08-19 DIAGNOSIS — O0990 Supervision of high risk pregnancy, unspecified, unspecified trimester: Secondary | ICD-10-CM

## 2017-08-19 MED ORDER — LABETALOL HCL 200 MG PO TABS: 600.0000 mg | ORAL_TABLET | Freq: Two times a day (BID) | ORAL | 2 refills | Status: DC

## 2017-08-19 NOTE — Progress Notes (Signed)
Subjective:  Amanda Meza is a 27 y.o. G1P0 at 7459w1d being seen today for ongoing prenatal care.  She is currently monitored for the following issues for this high-risk pregnancy and has Supervision of high risk pregnancy, antepartum and Chronic hypertension during pregnancy, antepartum on their problem list.  Patient reports no complaints.  Contractions: Not present. Vag. Bleeding: None.  Movement: Present. Denies leaking of fluid.   The following portions of the patient's history were reviewed and updated as appropriate: allergies, current medications, past family history, past medical history, past social history, past surgical history and problem list. Problem list updated.  Objective:   Vitals:   08/19/17 1445 08/19/17 1449  BP: (!) 168/101 (!) 153/100  Pulse: 71 76  Weight: 175 lb 12.8 oz (79.7 kg)     Fetal Status: Fetal Heart Rate (bpm): 140 Fundal Height: 25 cm Movement: Present     General:  Alert, oriented and cooperative. Patient is in no acute distress.  Skin: Skin is warm and dry. No rash noted.   Cardiovascular: Normal heart rate noted  Respiratory: Normal respiratory effort, no problems with respiration noted  Abdomen: Soft, gravid, appropriate for gestational age. Pain/Pressure: Absent     Pelvic: Vag. Bleeding: None     Cervical exam deferred        Extremities: Normal range of motion.  Edema: None  Mental Status: Normal mood and affect. Normal behavior. Normal judgment and thought content.   Urinalysis:      Assessment and Plan:  Pregnancy: G1P0 at 1659w1d  1. Supervision of high risk pregnancy, antepartum Doing well. 28wk labs at next visit.   2. Chronic hypertension during pregnancy, antepartum BPs continue to be elevated. No PIH symptoms. Increase Labetalol to 600mg  BID. US today wnl; good fetal growth. Will need to start 28wk antenatal testing at next visit. Return for nurse visit in 2 weeks for BP recheck.   Preterm labor symptoms and general  obstetric precautions including but not limited to vaginal bleeding, contractions, leaking of fluid and fetal movement were reviewed in detail with the patient. Please refer to After Visit Summary for other counseling recommendations.  Return in about 1 month (around 09/16/2017) for ob visit.   Pincus LargePhelps, Aneth Schlagel Y, DO

## 2017-08-19 NOTE — Progress Notes (Signed)
Denied HA.

## 2017-09-02 ENCOUNTER — Ambulatory Visit: Payer: Medicaid Other

## 2017-09-11 ENCOUNTER — Ambulatory Visit: Payer: Medicaid Other

## 2017-09-16 ENCOUNTER — Inpatient Hospital Stay (HOSPITAL_COMMUNITY)
Admission: AD | Admit: 2017-09-16 | Discharge: 2017-09-16 | Disposition: A | Discharge status: Home / Self Care | Payer: Medicaid Other | Source: Ambulatory Visit | Attending: Obstetrics & Gynecology | Admitting: Obstetrics & Gynecology

## 2017-09-16 ENCOUNTER — Other Ambulatory Visit (HOSPITAL_COMMUNITY): Payer: Self-pay | Admitting: *Deleted

## 2017-09-16 ENCOUNTER — Other Ambulatory Visit: Payer: Self-pay

## 2017-09-16 ENCOUNTER — Encounter (HOSPITAL_COMMUNITY): Payer: Self-pay

## 2017-09-16 ENCOUNTER — Ambulatory Visit (HOSPITAL_COMMUNITY)
Admission: RE | Admit: 2017-09-16 | Discharge: 2017-09-16 | Disposition: A | Discharge status: Home / Self Care | Payer: Medicaid Other | Source: Ambulatory Visit | Attending: Obstetrics & Gynecology | Admitting: Obstetrics & Gynecology

## 2017-09-16 DIAGNOSIS — Z3689 Encounter for other specified antenatal screening: Secondary | ICD-10-CM

## 2017-09-16 DIAGNOSIS — O10919 Unspecified pre-existing hypertension complicating pregnancy, unspecified trimester: Secondary | ICD-10-CM

## 2017-09-16 DIAGNOSIS — O10913 Unspecified pre-existing hypertension complicating pregnancy, third trimester: Secondary | ICD-10-CM

## 2017-09-16 DIAGNOSIS — Z3A28 28 weeks gestation of pregnancy: Secondary | ICD-10-CM | POA: Insufficient documentation

## 2017-09-16 DIAGNOSIS — R030 Elevated blood-pressure reading, without diagnosis of hypertension: Secondary | ICD-10-CM | POA: Diagnosis present

## 2017-09-16 DIAGNOSIS — O0990 Supervision of high risk pregnancy, unspecified, unspecified trimester: Secondary | ICD-10-CM

## 2017-09-16 DIAGNOSIS — Z880 Allergy status to penicillin: Secondary | ICD-10-CM | POA: Insufficient documentation

## 2017-09-16 DIAGNOSIS — O10013 Pre-existing essential hypertension complicating pregnancy, third trimester: Secondary | ICD-10-CM | POA: Diagnosis not present

## 2017-09-16 DIAGNOSIS — O1002 Pre-existing essential hypertension complicating childbirth: Secondary | ICD-10-CM | POA: Diagnosis not present

## 2017-09-16 HISTORY — DX: Anxiety disorder, unspecified: F41.9

## 2017-09-16 LAB — COMPREHENSIVE METABOLIC PANEL
ALK PHOS: 70 U/L (ref 38–126)
ALT: 11 U/L — AB (ref 14–54)
AST: 16 U/L (ref 15–41)
Albumin: 3.1 g/dL — ABNORMAL LOW (ref 3.5–5.0)
Anion gap: 11 (ref 5–15)
BUN: 8 mg/dL (ref 6–20)
CALCIUM: 8.7 mg/dL — AB (ref 8.9–10.3)
CHLORIDE: 105 mmol/L (ref 101–111)
CO2: 20 mmol/L — ABNORMAL LOW (ref 22–32)
CREATININE: 0.54 mg/dL (ref 0.44–1.00)
Glucose, Bld: 84 mg/dL (ref 65–99)
Potassium: 3.9 mmol/L (ref 3.5–5.1)
SODIUM: 136 mmol/L (ref 135–145)
Total Bilirubin: 0.4 mg/dL (ref 0.3–1.2)
Total Protein: 7.3 g/dL (ref 6.5–8.1)

## 2017-09-16 LAB — URINALYSIS, ROUTINE W REFLEX MICROSCOPIC
BILIRUBIN URINE: NEGATIVE
Bacteria, UA: NONE SEEN
GLUCOSE, UA: NEGATIVE mg/dL
Hgb urine dipstick: NEGATIVE
KETONES UR: NEGATIVE mg/dL
LEUKOCYTES UA: NEGATIVE
NITRITE: NEGATIVE
PH: 6 (ref 5.0–8.0)
Protein, ur: 30 mg/dL — AB
SPECIFIC GRAVITY, URINE: 1.027 (ref 1.005–1.030)

## 2017-09-16 LAB — CBC
HCT: 33.3 % — ABNORMAL LOW (ref 36.0–46.0)
Hemoglobin: 11.9 g/dL — ABNORMAL LOW (ref 12.0–15.0)
MCH: 31.7 pg (ref 26.0–34.0)
MCHC: 35.7 g/dL (ref 30.0–36.0)
MCV: 88.8 fL (ref 78.0–100.0)
PLATELETS: 291 10*3/uL (ref 150–400)
RBC: 3.75 MIL/uL — AB (ref 3.87–5.11)
RDW: 12.2 % (ref 11.5–15.5)
WBC: 8.8 10*3/uL (ref 4.0–10.5)

## 2017-09-16 LAB — TYPE AND SCREEN
ABO/RH(D): A POS
ANTIBODY SCREEN: NEGATIVE

## 2017-09-16 LAB — PROTEIN / CREATININE RATIO, URINE
Creatinine, Urine: 218 mg/dL
PROTEIN CREATININE RATIO: 0.12 mg/mg{creat} (ref 0.00–0.15)
TOTAL PROTEIN, URINE: 27 mg/dL

## 2017-09-16 LAB — ABO/RH: ABO/RH(D): A POS

## 2017-09-16 MED ORDER — NIFEDIPINE ER OSMOTIC RELEASE 30 MG PO TB24
30.0000 mg | ORAL_TABLET | Freq: Every day | ORAL | Status: DC
  Administered 2017-09-16: 30 mg via ORAL
  Filled 2017-09-16: qty 1

## 2017-09-16 MED ORDER — HYDRALAZINE HCL 20 MG/ML IJ SOLN: 10.0000 mg | Freq: Once | INTRAMUSCULAR | Status: DC | PRN

## 2017-09-16 MED ORDER — LABETALOL HCL 5 MG/ML IV SOLN
20.0000 mg | INTRAVENOUS | Status: DC | PRN
  Administered 2017-09-16: 20 mg via INTRAVENOUS
  Filled 2017-09-16: qty 4

## 2017-09-16 MED ORDER — NIFEDIPINE ER 30 MG PO TB24: 30.0000 mg | ORAL_TABLET | Freq: Every day | ORAL | 1 refills | Status: DC

## 2017-09-16 NOTE — Discharge Instructions (Signed)

## 2017-09-16 NOTE — MAU Note (Signed)
Pt is a G1P0 at 28.1 weeks sent from office for increased BP.

## 2017-09-16 NOTE — ED Notes (Signed)
BP rechecked:  L arm  198/107 R arm  186/108  Readings given to Tommi Emery, U/S tech, to be reviewed by Dr. Ezzard Standing.

## 2017-09-16 NOTE — MAU Provider Note (Signed)
History     CSN: [id number]  Arrival date and time: 09/16/17 1352   First Provider Initiated Contact with Patient 09/16/17 1440     G1 .1 wks sent from MFM for elevated BPs. Pt denies HA, visual disturbances, epigastric pain, CP, and SOB. Reports good FM. No VB, LOF, or ctx. Her pregnancy is complicated by [facility] on Labetalol 200 TID but she is taking 500 bid. She took her medicine this am.   OB History    Gravida  1   Para      Term      Preterm      AB      Living        SAB      TAB      Ectopic      Multiple      Live Births              Past Medical History:  Diagnosis Date  . Anxiety   . Hypertension     Past Surgical History:  Procedure Laterality Date  . NO PAST SURGERIES      History reviewed. No pertinent family history.  Social History   Tobacco Use  . Smoking status: Never Smoker  . Smokeless tobacco: Never Used  Substance Use Topics  . Alcohol use: No    Frequency: Never  . Drug use: No    Allergies:  Allergies  Allergen Reactions  . Penicillins Rash    Has patient had a PCN reaction causing immediate rash, facial/tongue/throat swelling, SOB or lightheadedness with hypotension: Yes Has patient had a PCN reaction causing severe rash involving mucus membranes or skin necrosis: Yes Has patient had a PCN reaction that required hospitalization: No Has patient had a PCN reaction occurring within the last 10 years: No If all of the above answers are "NO", then may proceed with Cephalosporin use.     Medications Prior to Admission  Medication Sig Dispense Refill Last Dose  . aspirin EC 81 MG tablet Take 1 tablet (81 mg total) by mouth daily. 30 tablet 5 09/15/2017 at Unknown time  . labetalol (NORMODYNE) 200 MG tablet Take 3 tablets (600 mg total) by mouth 2 (two) times daily. (Patient taking differently: Take 500 mg by mouth 2 (two) times daily. ) 120 tablet 2 09/16/2017 at 0930  . Prenatal Multivit-Min-Fe-FA (PRENATAL VITAMINS PO)  Take 1 tablet by mouth daily.    Past Week at Unknown time  . Blood Pressure Monitoring (BLOOD PRESSURE CUFF) MISC Take blood pressure 2-3x/day 1 each 0 Taking    Review of Systems  Eyes: Negative for visual disturbance.  Respiratory: Negative for shortness of breath.   Cardiovascular: Negative for chest pain.  Gastrointestinal: Negative for abdominal pain.  Genitourinary: Negative for vaginal bleeding.  Neurological: Negative for headaches.   Physical Exam   Blood pressure (!) 142/81, pulse 90, last menstrual period 03/03/2017.  Patient Vitals for the past 24 hrs:  BP Pulse  09/16/17 1619 (!) 142/81 90  09/16/17 1554 (!) 163/102 78  09/16/17 1546 (!) 148/96 74  09/16/17 1531 (!) 155/98 76  09/16/17 1516 (!) 160/99 75  09/16/17 1501 (!) 157/92 72  09/16/17 1446 (!) 156/92 72  09/16/17 1441 (!) 187/102 72  09/16/17 1416 (!) 186/113 82  09/16/17 1410 (!) 215/112 80    Physical Exam  Constitutional: She is oriented to person, place, and time. She appears well-developed and well-nourished. No distress.  HENT:  Head: Normocephalic and atraumatic.  Neck:  Normal range of motion.  Cardiovascular: Normal rate.  Respiratory: Effort normal. No respiratory distress.  GI: Soft. She exhibits no distension. There is no tenderness.  gravid  Musculoskeletal: Normal range of motion. She exhibits no edema.  Neurological: She is alert and oriented to person, place, and time.  Skin: Skin is warm and dry.  Psychiatric: She has a normal mood and affect.  EFM: 135 bpm, mod variability, + accels, no decels Toco: none  Results for orders placed or performed during the hospital encounter of 09/16/17 (from the past 24 hour(s))  Protein / creatinine ratio, urine     Status: None   Collection Time: 09/16/17  4:05 AM  Result Value Ref Range   Creatinine, Urine 218.00 mg/dL   Total Protein, Urine 27 mg/dL   Protein Creatinine Ratio 0.12 0.00 - 0.15 mg/mg[Cre]  Urinalysis, Routine w reflex  microscopic     Status: Abnormal   Collection Time: 09/16/17  2:05 PM  Result Value Ref Range   Color, Urine YELLOW YELLOW   APPearance CLEAR CLEAR   Specific Gravity, Urine 1.027 1.005 - 1.030   pH 6.0 5.0 - 8.0   Glucose, UA NEGATIVE NEGATIVE mg/dL   Hgb urine dipstick NEGATIVE NEGATIVE   Bilirubin Urine NEGATIVE NEGATIVE   Ketones, ur NEGATIVE NEGATIVE mg/dL   Protein, ur 30 (A) NEGATIVE mg/dL   Nitrite NEGATIVE NEGATIVE   Leukocytes, UA NEGATIVE NEGATIVE   RBC / HPF 0-5 0 - 5 RBC/hpf   WBC, UA 0-5 0 - 5 WBC/hpf   Bacteria, UA NONE SEEN NONE SEEN   Squamous Epithelial / LPF 6-10 0 - 5   Mucus PRESENT   CBC     Status: Abnormal   Collection Time: 09/16/17  2:35 PM  Result Value Ref Range   WBC 8.8 4.0 - 10.5 K/uL   RBC 3.75 (L) 3.87 - 5.11 MIL/uL   Hemoglobin 11.9 (L) 12.0 - 15.0 g/dL   HCT 47.8 (L) 29.5 - 62.1 %   MCV 88.8 78.0 - 100.0 fL   MCH 31.7 26.0 - 34.0 pg   MCHC 35.7 30.0 - 36.0 g/dL   RDW 30.8 65.7 - 84.6 %   Platelets 291 150 - 400 K/uL  Comprehensive metabolic panel     Status: Abnormal   Collection Time: 09/16/17  2:35 PM  Result Value Ref Range   Sodium 136 135 - 145 mmol/L   Potassium 3.9 3.5 - 5.1 mmol/L   Chloride 105 101 - 111 mmol/L   CO2 20 (L) 22 - 32 mmol/L   Glucose, Bld 84 65 - 99 mg/dL   BUN 8 6 - 20 mg/dL   Creatinine, Ser 9.62 0.44 - 1.00 mg/dL   Calcium 8.7 (L) 8.9 - 10.3 mg/dL   Total Protein 7.3 6.5 - 8.1 g/dL   Albumin 3.1 (L) 3.5 - 5.0 g/dL   AST 16 15 - 41 U/L   ALT 11 (L) 14 - 54 U/L   Alkaline Phosphatase 70 38 - 126 U/L   Total Bilirubin 0.4 0.3 - 1.2 mg/dL   GFR calc non Af Amer >60 >60 mL/min   GFR calc Af Amer >60 >60 mL/min   Anion gap 11 5 - 15  Type and screen [facility]     Status: None   Collection Time: 09/16/17  2:36 PM  Result Value Ref Range   ABO/RH(D) A POS    Antibody Screen NEG    Sample Expiration  09/19/2017 Performed at [facility], [address] [place], [place]  [address]    MAU Course  Procedures Labetalol  MDM Labs ordered and reviewed. No evidence of pre-e. Consult with Dr. Erin Fulling, plan to ad Procardia. Stable for discharge home.  Assessment and Plan   1. [redacted] weeks gestation of pregnancy   2. Supervision of high risk pregnancy, antepartum   3. Chronic hypertension during pregnancy, antepartum   4. NST (non-stress test) reactive    Discharge home Follow up in OB office tomorrow as scheduled Pre-e precautions Rx Procardia 30 XL daily  Allergies as of 09/16/2017      Reactions   Penicillins Rash   Has patient had a PCN reaction causing immediate rash, facial/tongue/throat swelling, SOB or lightheadedness with hypotension: Yes Has patient had a PCN reaction causing severe rash involving mucus membranes or skin necrosis: Yes Has patient had a PCN reaction that required hospitalization: No Has patient had a PCN reaction occurring within the last 10 years: No If all of the above answers are "NO", then may proceed with Cephalosporin use.      Medication List    TAKE these medications   aspirin EC 81 MG tablet Take 1 tablet (81 mg total) by mouth daily.   Blood Pressure Cuff Misc Take blood pressure 2-3x/day   labetalol 200 MG tablet Commonly known as:  NORMODYNE Take 3 tablets (600 mg total) by mouth 2 (two) times daily. What changed:  how much to take   NIFEdipine 30 MG 24 hr tablet Commonly known as:  PROCARDIA-XL/ADALAT CC Take 1 tablet (30 mg total) by mouth daily.   PRENATAL VITAMINS PO Take 1 tablet by mouth daily.      Donette Larry, CNM 09/16/2017, 4:25 PM

## 2017-09-16 NOTE — ED Notes (Signed)
Report called to L. Vickki Muff, Charity fundraiser.  Pt to MAU for evaluation of elevated BP.

## 2017-09-17 ENCOUNTER — Ambulatory Visit (INDEPENDENT_AMBULATORY_CARE_PROVIDER_SITE_OTHER): Payer: Medicaid Other | Admitting: Obstetrics & Gynecology

## 2017-09-17 ENCOUNTER — Other Ambulatory Visit: Payer: Medicaid Other

## 2017-09-17 VITALS — BP 166/104 | HR 95 | Wt 176.9 lb

## 2017-09-17 DIAGNOSIS — O0990 Supervision of high risk pregnancy, unspecified, unspecified trimester: Secondary | ICD-10-CM

## 2017-09-17 DIAGNOSIS — O10919 Unspecified pre-existing hypertension complicating pregnancy, unspecified trimester: Secondary | ICD-10-CM

## 2017-09-17 MED ORDER — NIFEDIPINE ER 30 MG PO TB24
60.0000 mg | ORAL_TABLET | Freq: Every day | ORAL | 1 refills | Status: DC
Start: 2017-09-17 — End: 2017-09-25

## 2017-09-17 NOTE — Patient Instructions (Signed)

## 2017-09-17 NOTE — Progress Notes (Signed)
   PRENATAL VISIT NOTE  Subjective:  Amanda Meza is a 27 y.o. G1P0 at [redacted]w[redacted]d being seen today for ongoing prenatal care.  She is currently monitored for the following issues for this high-risk pregnancy and has Supervision of high risk pregnancy, antepartum and Chronic hypertension during pregnancy, antepartum on their problem list.  Patient reports nausea and emesis after taking her Labetalol. Was seen in ED yesterday due to elevated BP and Procardia added which she tolerates.  Contractions: Not present. Vag. Bleeding: None.  Movement: Present. Denies leaking of fluid.   The following portions of the patient's history were reviewed and updated as appropriate: allergies, current medications, past family history, past medical history, past social history, past surgical history and problem list. Problem list updated.  Objective:   Vitals:   09/17/17 0848  BP: (!) 166/104  Pulse: 95  Weight: 176 lb 14.4 oz (80.2 kg)    Fetal Status: Fetal Heart Rate (bpm): 127 Fundal Height: 29 cm Movement: Present     General:  Alert, oriented and cooperative. Patient is in no acute distress.  Skin: Skin is warm and dry. No rash noted.   Cardiovascular: Normal heart rate noted  Respiratory: Normal respiratory effort, no problems with respiration noted  Abdomen: Soft, gravid, appropriate for gestational age.  Pain/Pressure: Absent     Pelvic: Cervical exam deferred        Extremities: Normal range of motion.  Edema: None  Mental Status: Normal mood and affect. Normal behavior. Normal judgment and thought content.   Assessment and Plan:  Pregnancy: G1P0 at [redacted]w[redacted]d  1. Chronic hypertension during pregnancy, antepartum Change from Labetalol to Procardia  2. Supervision of high risk pregnancy, antepartum Normal growth by [place] yesterday  Preterm labor symptoms and general obstetric precautions including but not limited to vaginal bleeding, contractions, leaking of fluid and fetal movement were  reviewed in detail with the patient. Please refer to After Visit Summary for other counseling recommendations.  Return in about 1 week (around 09/24/2017).  Future Appointments  Date Time Provider Department Center  10/14/2017  2:30 PM [facility] [place] 1 [facility]    Scheryl Darter, MD

## 2017-09-18 LAB — CBC
HEMOGLOBIN: 11.4 g/dL (ref 11.1–15.9)
Hematocrit: 35 % (ref 34.0–46.6)
MCH: 30.3 pg (ref 26.6–33.0)
MCHC: 32.6 g/dL (ref 31.5–35.7)
MCV: 93 fL (ref 79–97)
Platelets: 314 10*3/uL (ref 150–379)
RBC: 3.76 x10E6/uL — ABNORMAL LOW (ref 3.77–5.28)
RDW: 13.1 % (ref 12.3–15.4)
WBC: 9.2 10*3/uL (ref 3.4–10.8)

## 2017-09-18 LAB — HIV ANTIBODY (ROUTINE TESTING W REFLEX): HIV Screen 4th Generation wRfx: NONREACTIVE

## 2017-09-18 LAB — GLUCOSE TOLERANCE, 2 HOURS W/ 1HR
GLUCOSE, 2 HOUR: 148 mg/dL (ref 65–152)
Glucose, 1 hour: 202 mg/dL — ABNORMAL HIGH (ref 65–179)
Glucose, Fasting: 82 mg/dL (ref 65–91)

## 2017-09-18 LAB — RPR: RPR: NONREACTIVE

## 2017-09-21 ENCOUNTER — Encounter: Payer: Self-pay | Admitting: Certified Nurse Midwife

## 2017-09-21 ENCOUNTER — Inpatient Hospital Stay (HOSPITAL_COMMUNITY)
Admission: AD | Admit: 2017-09-21 | Discharge: 2017-09-25 | DRG: 833 | Disposition: A | Discharge status: Home / Self Care | Payer: Medicaid Other | Source: Ambulatory Visit | Attending: Obstetrics and Gynecology | Admitting: Obstetrics and Gynecology

## 2017-09-21 ENCOUNTER — Other Ambulatory Visit: Payer: Self-pay

## 2017-09-21 DIAGNOSIS — Z7982 Long term (current) use of aspirin: Secondary | ICD-10-CM

## 2017-09-21 DIAGNOSIS — O10013 Pre-existing essential hypertension complicating pregnancy, third trimester: Secondary | ICD-10-CM | POA: Diagnosis present

## 2017-09-21 DIAGNOSIS — Z880 Allergy status to penicillin: Secondary | ICD-10-CM

## 2017-09-21 DIAGNOSIS — O119 Pre-existing hypertension with pre-eclampsia, unspecified trimester: Secondary | ICD-10-CM | POA: Diagnosis present

## 2017-09-21 DIAGNOSIS — O24419 Gestational diabetes mellitus in pregnancy, unspecified control: Secondary | ICD-10-CM | POA: Diagnosis present

## 2017-09-21 DIAGNOSIS — Z3A28 28 weeks gestation of pregnancy: Secondary | ICD-10-CM | POA: Diagnosis not present

## 2017-09-21 DIAGNOSIS — O10913 Unspecified pre-existing hypertension complicating pregnancy, third trimester: Secondary | ICD-10-CM

## 2017-09-21 DIAGNOSIS — O113 Pre-existing hypertension with pre-eclampsia, third trimester: Principal | ICD-10-CM | POA: Diagnosis present

## 2017-09-21 DIAGNOSIS — R030 Elevated blood-pressure reading, without diagnosis of hypertension: Secondary | ICD-10-CM | POA: Diagnosis present

## 2017-09-21 DIAGNOSIS — O10919 Unspecified pre-existing hypertension complicating pregnancy, unspecified trimester: Secondary | ICD-10-CM

## 2017-09-21 LAB — URINALYSIS, ROUTINE W REFLEX MICROSCOPIC
BILIRUBIN URINE: NEGATIVE
Glucose, UA: NEGATIVE mg/dL
Hgb urine dipstick: NEGATIVE
KETONES UR: 80 mg/dL — AB
Nitrite: NEGATIVE
Protein, ur: 100 mg/dL — AB
SPECIFIC GRAVITY, URINE: 1.021 (ref 1.005–1.030)
pH: 5 (ref 5.0–8.0)

## 2017-09-21 LAB — GLUCOSE, CAPILLARY: Glucose-Capillary: 94 mg/dL (ref 65–99)

## 2017-09-21 LAB — COMPREHENSIVE METABOLIC PANEL
ALT: 12 U/L — ABNORMAL LOW (ref 14–54)
ANION GAP: 11 (ref 5–15)
AST: 16 U/L (ref 15–41)
Albumin: 3.3 g/dL — ABNORMAL LOW (ref 3.5–5.0)
Alkaline Phosphatase: 69 U/L (ref 38–126)
BUN: 9 mg/dL (ref 6–20)
CHLORIDE: 105 mmol/L (ref 101–111)
CO2: 18 mmol/L — ABNORMAL LOW (ref 22–32)
Calcium: 8.6 mg/dL — ABNORMAL LOW (ref 8.9–10.3)
Creatinine, Ser: 0.45 mg/dL (ref 0.44–1.00)
GFR calc non Af Amer: >60 mL/min (ref >60)
Glucose, Bld: 76 mg/dL (ref 65–99)
Potassium: 3.4 mmol/L — ABNORMAL LOW (ref 3.5–5.1)
Sodium: 134 mmol/L — ABNORMAL LOW (ref 135–145)
Total Bilirubin: 0.6 mg/dL (ref 0.3–1.2)
Total Protein: 7.6 g/dL (ref 6.5–8.1)

## 2017-09-21 LAB — TSH: TSH: 0.853 u[IU]/mL (ref 0.350–4.500)

## 2017-09-21 LAB — CBC
HCT: 34 % — ABNORMAL LOW (ref 36.0–46.0)
Hemoglobin: 12.1 g/dL (ref 12.0–15.0)
MCH: 31.3 pg (ref 26.0–34.0)
MCHC: 35.6 g/dL (ref 30.0–36.0)
MCV: 88.1 fL (ref 78.0–100.0)
PLATELETS: 294 10*3/uL (ref 150–400)
RBC: 3.86 MIL/uL — AB (ref 3.87–5.11)
RDW: 12.3 % (ref 11.5–15.5)
WBC: 9.8 10*3/uL (ref 4.0–10.5)

## 2017-09-21 LAB — PROTEIN / CREATININE RATIO, URINE
CREATININE, URINE: 306 mg/dL
Protein Creatinine Ratio: 0.22 mg/mg{Cre} — ABNORMAL HIGH (ref 0.00–0.15)
TOTAL PROTEIN, URINE: 67 mg/dL

## 2017-09-21 LAB — TYPE AND SCREEN
ABO/RH(D): A POS
Antibody Screen: NEGATIVE

## 2017-09-21 MED ORDER — NIFEDIPINE ER OSMOTIC RELEASE 30 MG PO TB24
60.0000 mg | ORAL_TABLET | Freq: Every day | ORAL | Status: DC
  Administered 2017-09-22 – 2017-09-23 (×2): 60 mg via ORAL
  Filled 2017-09-21 (×2): qty 2

## 2017-09-21 MED ORDER — PRENATAL MULTIVITAMIN CH
1.0000 | ORAL_TABLET | Freq: Every day | ORAL | Status: DC
  Administered 2017-09-22 – 2017-09-24 (×3): 1 via ORAL
  Filled 2017-09-21 (×3): qty 1

## 2017-09-21 MED ORDER — MAGNESIUM SULFATE 40 G IN LACTATED RINGERS - SIMPLE
2.0000 g/h | INTRAVENOUS | Status: AC
  Administered 2017-09-22: 2 g/h via INTRAVENOUS
  Filled 2017-09-21: qty 500
  Filled 2017-09-21: qty 40

## 2017-09-21 MED ORDER — ASPIRIN EC 81 MG PO TBEC
81.0000 mg | DELAYED_RELEASE_TABLET | Freq: Every day | ORAL | Status: DC
  Administered 2017-09-22 – 2017-09-25 (×4): 81 mg via ORAL
  Filled 2017-09-21 (×4): qty 1

## 2017-09-21 MED ORDER — LABETALOL HCL 5 MG/ML IV SOLN
20.0000 mg | INTRAVENOUS | Status: AC | PRN
  Administered 2017-09-21: 20 mg via INTRAVENOUS
  Administered 2017-09-21: 80 mg via INTRAVENOUS
  Administered 2017-09-21: 20 mg via INTRAVENOUS
  Filled 2017-09-21: qty 4
  Filled 2017-09-21: qty 16
  Filled 2017-09-21: qty 4

## 2017-09-21 MED ORDER — LACTATED RINGERS IV SOLN
INTRAVENOUS | Status: DC
  Administered 2017-09-22: 14:00:00 via INTRAVENOUS

## 2017-09-21 MED ORDER — MAGNESIUM SULFATE 40 G IN LACTATED RINGERS - SIMPLE
2.0000 g/h | INTRAVENOUS | Status: DC
  Filled 2017-09-21: qty 500

## 2017-09-21 MED ORDER — MAGNESIUM SULFATE BOLUS VIA INFUSION
4.0000 g | Freq: Once | INTRAVENOUS | Status: AC
  Administered 2017-09-21: 4 g via INTRAVENOUS
  Filled 2017-09-21: qty 500

## 2017-09-21 MED ORDER — BETAMETHASONE SOD PHOS & ACET 6 (3-3) MG/ML IJ SUSP
12.0000 mg | INTRAMUSCULAR | Status: AC
  Administered 2017-09-21 – 2017-09-22 (×2): 12 mg via INTRAMUSCULAR
  Filled 2017-09-21 (×2): qty 2

## 2017-09-21 MED ORDER — DOCUSATE SODIUM 100 MG PO CAPS: 100.0000 mg | ORAL_CAPSULE | Freq: Two times a day (BID) | ORAL | Status: DC | PRN

## 2017-09-21 MED ORDER — ENOXAPARIN SODIUM 40 MG/0.4ML ~~LOC~~ SOLN
40.0000 mg | SUBCUTANEOUS | Status: DC
  Filled 2017-09-21 (×3): qty 0.4

## 2017-09-21 MED ORDER — ACETAMINOPHEN 325 MG PO TABS: 650.0000 mg | ORAL_TABLET | ORAL | Status: DC | PRN

## 2017-09-21 MED ORDER — CALCIUM CARBONATE ANTACID 500 MG PO CHEW: 2.0000 | CHEWABLE_TABLET | ORAL | Status: DC | PRN

## 2017-09-21 MED ORDER — DOCUSATE SODIUM 100 MG PO CAPS: 100.0000 mg | ORAL_CAPSULE | Freq: Every day | ORAL | Status: DC

## 2017-09-21 MED ORDER — HYDRALAZINE HCL 20 MG/ML IJ SOLN
10.0000 mg | Freq: Once | INTRAMUSCULAR | Status: AC | PRN
  Administered 2017-09-23: 10 mg via INTRAVENOUS
  Filled 2017-09-21: qty 1

## 2017-09-21 NOTE — Progress Notes (Addendum)
Called by RN re: ctx and variable. Ctx and irritability noted q4-5 min with mild variables. Pt not feeling ctx. SVE closed/thick. Dr. Vergie Living notified and will follow.

## 2017-09-21 NOTE — MAU Note (Signed)
Elevated BPs

## 2017-09-21 NOTE — H&P (Signed)
OBSTETRIC ADMISSION HISTORY AND PHYSICAL  Amanda Meza is a 27 y.o. female G1P0 with IUP at [redacted]w[redacted]d presenting for elevated BPs. She reports +FMs, No LOF, no VB, no blurry vision, headaches or peripheral edema, and RUQ pain. Evaluation in MAU revealed severe range BPs despite oupt treatment with Procardia 60 mg XL.  Dating: By LMP --->  Estimated Date of Delivery: 12/08/17  Sono:    , CWD, normal anatomy, vtx presentation, 1094g, 39%ile, EFW 2'7  Prenatal History/Complications: -CHTN -GDM-recent dx, pt unaware  Past Medical History: Past Medical History:  Diagnosis Date  . Anxiety   . Hypertension     Past Surgical History: Past Surgical History:  Procedure Laterality Date  . NO PAST SURGERIES      Obstetrical History: OB History    Gravida  1   Para      Term      Preterm      AB      Living        SAB      TAB      Ectopic      Multiple      Live Births              Social History: Social History   Socioeconomic History  . Marital status: Single    Spouse name: Not on file  . Number of children: Not on file  . Years of education: Not on file  . Highest education level: Not on file  Occupational History  . Not on file  Social Needs  . Financial resource strain: Not on file  . Food insecurity:    Worry: Not on file    Inability: Not on file  . Transportation needs:    Medical: Not on file    Non-medical: Not on file  Tobacco Use  . Smoking status: Never Smoker  . Smokeless tobacco: Never Used  Substance and Sexual Activity  . Alcohol use: No    Frequency: Never  . Drug use: No  . Sexual activity: Yes    Birth control/protection: None  Lifestyle  . Physical activity:    Days per week: Not on file    Minutes per session: Not on file  . Stress: Not on file  Relationships  . Social connections:    Talks on phone: Not on file    Gets together: Not on file    Attends religious service: Not on file    Active member of  club or organization: Not on file    Attends meetings of clubs or organizations: Not on file    Relationship status: Not on file  Other Topics Concern  . Not on file  Social History Narrative  . Not on file    Family History: No family history on file.  Allergies: Allergies  Allergen Reactions  . Penicillins Rash    Has patient had a PCN reaction causing immediate rash, facial/tongue/throat swelling, SOB or lightheadedness with hypotension: Yes Has patient had a PCN reaction causing severe rash involving mucus membranes or skin necrosis: Yes Has patient had a PCN reaction that required hospitalization: No Has patient had a PCN reaction occurring within the last 10 years: No If all of the above answers are "NO", then may proceed with Cephalosporin use.     Medications Prior to Admission  Medication Sig Dispense Refill Last Dose  . aspirin EC 81 MG tablet Take 1 tablet (81 mg total) by mouth daily. 30 tablet 5 Taking  .  Blood Pressure Monitoring (BLOOD PRESSURE CUFF) MISC Take blood pressure 2-3x/day 1 each 0 Taking  . NIFEdipine (PROCARDIA-XL/ADALAT CC) 30 MG 24 hr tablet Take 2 tablets (60 mg total) by mouth daily. 60 tablet 1   . Prenatal Multivit-Min-Fe-FA (PRENATAL VITAMINS PO) Take 1 tablet by mouth daily.    Taking     Review of Systems   All systems reviewed and negative except as stated in HPI  Blood pressure (!) 158/101, pulse 89, temperature 98.5 F (36.9 C), temperature source Oral, resp. rate 16, height  (1.651 m), weight 176 lb (79.8 kg), last menstrual period 03/03/2017, SpO2 100 %. General appearance: alert, cooperative and no distress Lungs: regular rate and effort Heart: regular rate  Abdomen: soft, non-tender Extremities: Homans sign is negative, no sign of DVT Presentation: unsure Fetal monitoringBaseline: 145 bpm, Variability: Good {> 6 bpm), Accelerations: Reactive and Decelerations: Variable: occ, mild Uterine activityNone    Prenatal  labs: ABO, Rh: --/--/A POS (05/01 1436) Antibody: NEG (05/01 1436) Rubella: 1.38 (02/13 1136) RPR: Non Reactive (05/02 0840)  HBsAg: Negative (02/13 1136)  HIV: Non Reactive (05/02 0840)  GBS:    2 hr Glucola failed Genetic screening  neg Anatomy US nml  Prenatal Transfer Tool  Maternal Diabetes: Yes:  Diabetes Type:  Diet controlled Genetic Screening: Normal Maternal Ultrasounds/Referrals: Normal Fetal Ultrasounds or other Referrals:  None Maternal Substance Abuse:  No Significant Maternal Medications:  Meds include: Other: Procardia, baby ASA Significant Maternal Lab Results: None  Results for orders placed or performed during the hospital encounter of 09/21/17 (from the past 24 hour(s))  Urinalysis, Routine w reflex microscopic   Collection Time: 09/21/17  5:33 PM  Result Value Ref Range   Color, Urine YELLOW YELLOW   APPearance CLEAR CLEAR   Specific Gravity, Urine 1.021 1.005 - 1.030   pH 5.0 5.0 - 8.0   Glucose, UA NEGATIVE NEGATIVE mg/dL   Hgb urine dipstick NEGATIVE NEGATIVE   Bilirubin Urine NEGATIVE NEGATIVE   Ketones, ur 80 (A) NEGATIVE mg/dL   Protein, ur 161 (A) NEGATIVE mg/dL   Nitrite NEGATIVE NEGATIVE   Leukocytes, UA MODERATE (A) NEGATIVE   RBC / HPF 0-5 0 - 5 RBC/hpf   WBC, UA 11-20 0 - 5 WBC/hpf   Bacteria, UA RARE (A) NONE SEEN   Squamous Epithelial / LPF 0-5 0 - 5   Mucus PRESENT     Patient Active Problem List   Diagnosis Date Noted  . Gestational diabetes 09/21/2017  . Chronic hypertension with superimposed pre-eclampsia 09/21/2017  . Supervision of high risk pregnancy, antepartum 07/01/2017  . Chronic hypertension during pregnancy, antepartum 07/01/2017    Assessment: Amanda Meza is a 27 y.o. G1P0 at [redacted]w[redacted]d here for [facility] w/superimposed pre-e   Plan: 1. Admit to Ante 2. BMZ 3. Mag Sulfate 4. Procardia 5. CBGs  Donette Larry, CNM  09/21/2017, 6:49 PM

## 2017-09-21 NOTE — MAU Note (Signed)
Pt stated she took her b/p at work and I t was in the 150's. Denies any H.A., dizziness or visual changes. Good fetal movement reported.

## 2017-09-22 DIAGNOSIS — O113 Pre-existing hypertension with pre-eclampsia, third trimester: Principal | ICD-10-CM

## 2017-09-22 LAB — COMPREHENSIVE METABOLIC PANEL
ALK PHOS: 68 U/L (ref 38–126)
ALT: 13 U/L — ABNORMAL LOW (ref 14–54)
ANION GAP: 10 (ref 5–15)
AST: 16 U/L (ref 15–41)
Albumin: 2.9 g/dL — ABNORMAL LOW (ref 3.5–5.0)
BUN: 10 mg/dL (ref 6–20)
CALCIUM: 7.9 mg/dL — AB (ref 8.9–10.3)
CO2: 20 mmol/L — AB (ref 22–32)
Chloride: 104 mmol/L (ref 101–111)
Creatinine, Ser: 0.51 mg/dL (ref 0.44–1.00)
Glucose, Bld: 140 mg/dL — ABNORMAL HIGH (ref 65–99)
Potassium: 4.1 mmol/L (ref 3.5–5.1)
SODIUM: 134 mmol/L — AB (ref 135–145)
Total Bilirubin: 0.4 mg/dL (ref 0.3–1.2)
Total Protein: 7 g/dL (ref 6.5–8.1)

## 2017-09-22 LAB — CBC
HCT: 33.2 % — ABNORMAL LOW (ref 36.0–46.0)
HEMOGLOBIN: 11.6 g/dL — AB (ref 12.0–15.0)
MCH: 31 pg (ref 26.0–34.0)
MCHC: 34.9 g/dL (ref 30.0–36.0)
MCV: 88.8 fL (ref 78.0–100.0)
Platelets: 275 10*3/uL (ref 150–400)
RBC: 3.74 MIL/uL — AB (ref 3.87–5.11)
RDW: 12.2 % (ref 11.5–15.5)
WBC: 10.2 10*3/uL (ref 4.0–10.5)

## 2017-09-22 LAB — GLUCOSE, CAPILLARY
GLUCOSE-CAPILLARY: 119 mg/dL — AB (ref 65–99)
GLUCOSE-CAPILLARY: 139 mg/dL — AB (ref 65–99)

## 2017-09-22 MED ORDER — HYDRALAZINE HCL 20 MG/ML IJ SOLN
10.0000 mg | Freq: Once | INTRAMUSCULAR | Status: AC
  Administered 2017-09-22: 10 mg via INTRAVENOUS
  Filled 2017-09-22: qty 1

## 2017-09-22 NOTE — Progress Notes (Signed)
[place] COMPREHENSIVE PROGRESS NOTE  Amanda Meza is a 27 y.o. G1P0 at 101w0dwho [facility]  Estimated Date of Delivery: 12/08/17 Fetal presentation is cephalic.  Length of Stay:  1 Days. Admitted 09/21/2017  Subjective: Denies any headache, visual symptoms, RUQ/epigastric pain Patient reports good fetal movement.  She reports no uterine contractions, no bleeding and no loss of fluid per vagina.  Vitals:  Blood pressure (!) 155/97, pulse (!) 104, temperature 98.1 F (36.7 C), temperature source Oral, resp. rate 17, height '5\' 5"'$  (1.651 m), weight 176 lb (79.8 kg), last menstrual period 03/03/2017, SpO2 100 %. Physical Examination: CONSTITUTIONAL: Well-developed, well-nourished female in no acute distress.  HENT:  Normocephalic, atraumatic, External right and left ear normal. Oropharynx is clear and moist EYES: Conjunctivae and EOM are normal. Pupils are equal, round, and reactive to light. No scleral icterus.  NECK: Normal range of motion, supple, no masses SKIN: Skin is warm and dry. No rash noted. Not diaphoretic. No erythema. No pallor. [place] Alert and oriented to person, place, and time. Normal reflexes, muscle tone coordination. No cranial nerve deficit noted. PSYCHIATRIC: Normal mood and affect. Normal behavior. Normal judgment and thought content. CARDIOVASCULAR: Normal heart rate noted, regular rhythm RESPIRATORY: Effort and breath sounds normal, no problems with respiration noted MUSCULOSKELETAL: Normal range of motion. No edema and no tenderness. 2+ distal pulses. ABDOMEN: Soft, nontender, nondistended, gravid. CERVIX: Dilation: Closed Exam by:: MJulianne Handler CNM  Fetal monitoring: FHR: 120 bpm, Variability: moderate, Accelerations: Present, Decelerations: Absent  Uterine activity: No contractions   Results for orders placed or performed during the hospital encounter of 09/21/17 (from the past  48 hour(s))  Urinalysis, Routine w reflex microscopic     Status: Abnormal   Collection Time: 09/21/17  5:33 PM  Result Value Ref Range   Color, Urine YELLOW YELLOW   APPearance CLEAR CLEAR   Specific Gravity, Urine 1.021 1.005 - 1.030   pH 5.0 5.0 - 8.0   Glucose, UA NEGATIVE NEGATIVE mg/dL   Hgb urine dipstick NEGATIVE NEGATIVE   Bilirubin Urine NEGATIVE NEGATIVE   Ketones, ur 80 (A) NEGATIVE mg/dL   Protein, ur 100 (A) NEGATIVE mg/dL   Nitrite NEGATIVE NEGATIVE   Leukocytes, UA MODERATE (A) NEGATIVE   RBC / HPF 0-5 0 - 5 RBC/hpf   WBC, UA 11-20 0 - 5 WBC/hpf   Bacteria, UA RARE (A) NONE SEEN   Squamous Epithelial / LPF 0-5 0 - 5    Comment: Please note change in reference range.   Mucus PRESENT     Comment: [facility]formed at [facility] [address], [place] [address] Protein / creatinine ratio, urine     Status: Abnormal   Collection Time: 09/21/17  5:33 PM  Result Value Ref Range   Creatinine, Urine 306.00 mg/dL   Total Protein, Urine 67 mg/dL    Comment: NO NORMAL RANGE ESTABLISHED FOR THIS TEST   Protein Creatinine Ratio 0.22 (H) 0.00 - 0.15 mg/mg[Cre]    Comment: [facility] [address], [place] [address] Type and screen [place]    Status: None   Collection Time: 09/21/17  6:40 PM  Result Value Ref Range   ABO/RH(D) A POS    Antibody Screen NEG    Sample Expiration      09/24/2017 [facility] [address], [place] [address]  TSH  Status: None   Collection Time: 09/21/17  6:40 PM  Result Value Ref Range   TSH 0.853 0.350 - 4.500 uIU/mL    Comment: Performed by a 3rd Generation assay with a functional sensitivity of <=0.01 uIU/mL. Performed at [facility], [address], [place], [place] [address]   CBC     Status: Abnormal   Collection Time: 09/21/17  6:41 PM  Result Value Ref Range   WBC 9.8 4.0 - 10.5 K/uL   RBC 3.86 (L) 3.87 - 5.11 MIL/uL    Hemoglobin 12.1 12.0 - 15.0 g/dL   HCT 34.0 (L) 36.0 - 46.0 %   MCV 88.1 78.0 - 100.0 fL   MCH 31.3 26.0 - 34.0 pg   MCHC 35.6 30.0 - 36.0 g/dL   RDW 12.3 11.5 - 15.5 %   Platelets 294 150 - 400 K/uL    Comment: Performed at [facility], [address] [place], [place] [address]  Comprehensive metabolic panel     Status: Abnormal   Collection Time: 09/21/17  6:41 PM  Result Value Ref Range   Sodium 134 (L) 135 - 145 mmol/L   Potassium 3.4 (L) 3.5 - 5.1 mmol/L   Chloride 105 101 - 111 mmol/L   CO2 18 (L) 22 - 32 mmol/L   Glucose, Bld 76 65 - 99 mg/dL   BUN 9 6 - 20 mg/dL   Creatinine, Ser 0.45 0.44 - 1.00 mg/dL   Calcium 8.6 (L) 8.9 - 10.3 mg/dL   Total Protein 7.6 6.5 - 8.1 g/dL   Albumin 3.3 (L) 3.5 - 5.0 g/dL   AST 16 15 - 41 U/L   ALT 12 (L) 14 - 54 U/L   Alkaline Phosphatase 69 38 - 126 U/L   Total Bilirubin 0.6 0.3 - 1.2 mg/dL   GFR calc non Af Amer >60 >60 mL/min   GFR calc Af Amer >60 >60 mL/min    Comment: (NOTE) The eGFR has been calculated using the CKD EPI equation. This calculation has not been validated in all clinical situations. eGFR's persistently <60 mL/min signify possible Chronic Kidney Disease.    Anion gap 11 5 - 15    Comment: Performed at [facility], [address], [place], [place] [address]  Glucose, capillary     Status: None   Collection Time: 09/21/17  9:20 PM  Result Value Ref Range   Glucose-Capillary 94 65 - 99 mg/dL  Comprehensive metabolic panel     Status: Abnormal   Collection Time: 09/22/17  5:46 AM  Result Value Ref Range   Sodium 134 (L) 135 - 145 mmol/L   Potassium 4.1 3.5 - 5.1 mmol/L    Comment: DELTA CHECK NOTED REPEATED TO VERIFY NO VISIBLE HEMOLYSIS    Chloride 104 101 - 111 mmol/L   CO2 20 (L) 22 - 32 mmol/L   Glucose, Bld 140 (H) 65 - 99 mg/dL   BUN 10 6 - 20 mg/dL   Creatinine, Ser 0.51 0.44 - 1.00 mg/dL   Calcium 7.9 (L) 8.9 - 10.3 mg/dL   Total Protein 7.0 6.5 - 8.1 g/dL   Albumin 2.9 (L) 3.5 - 5.0  g/dL   AST 16 15 - 41 U/L   ALT 13 (L) 14 - 54 U/L   Alkaline Phosphatase 68 38 - 126 U/L   Total Bilirubin 0.4 0.3 - 1.2 mg/dL   GFR calc non Af Amer >60 >60 mL/min   GFR calc Af Amer >60 >60 mL/min    Comment: (  NOTE) The eGFR has been calculated using the CKD EPI equation. This calculation has not been validated in all clinical situations. eGFR's persistently <60 mL/min signify possible Chronic Kidney Disease.    Anion gap 10 5 - 15    Comment: Performed at [facility], [address], [place], [place] [address]  CBC     Status: Abnormal   Collection Time: 09/22/17  5:46 AM  Result Value Ref Range   WBC 10.2 4.0 - 10.5 K/uL   RBC 3.74 (L) 3.87 - 5.11 MIL/uL   Hemoglobin 11.6 (L) 12.0 - 15.0 g/dL   HCT 33.2 (L) 36.0 - 46.0 %   MCV 88.8 78.0 - 100.0 fL   MCH 31.0 26.0 - 34.0 pg   MCHC 34.9 30.0 - 36.0 g/dL   RDW 12.2 11.5 - 15.5 %   Platelets 275 150 - 400 K/uL    Comment: Performed at [facility], [address], [place], [place] [address]    No results found.  Current scheduled medications . aspirin EC  81 mg Oral Daily  . betamethasone acetate-betamethasone sodium phosphate  12 mg Intramuscular Q24 Hr x 2  . enoxaparin (LOVENOX) injection  40 mg Subcutaneous Q24H  . NIFEdipine  60 mg Oral Daily  . prenatal multivitamin  1 tablet Oral Q1200    I have reviewed the patient's current medications.  ASSESSMENT: Active Problems:   Chronic hypertension with superimposed pre-eclampsia   PLAN: Continue magnesium sulfate for now and betamethasone regimen Continue to monitor GDM; DM educator to be consulted Continue Procardia and hydralazine prn Continuous FHR monitoring for now Continue inpatient antenatal care.   Verita Schneiders, MD, [place] for [employer], [place]

## 2017-09-22 NOTE — Progress Notes (Signed)
Pt sitting up to eat from 1604 to 1636. Possibly tracing maternal HR. Informed pt to call out when she was finished eating so RN could adjust monitor.

## 2017-09-23 ENCOUNTER — Inpatient Hospital Stay (HOSPITAL_COMMUNITY): Payer: Medicaid Other

## 2017-09-23 LAB — CREATININE, URINE, 24 HOUR
Collection Interval-UCRE24: 24 hours
Creatinine, 24H Ur: 1392 mg/d (ref 600–1800)
Creatinine, Urine: 81.91 mg/dL
Urine Total Volume-UCRE24: 1700 mL

## 2017-09-23 LAB — PROTEIN, URINE, 24 HOUR
COLLECTION INTERVAL-UPROT: 24 h
Protein, 24H Urine: 374 mg/d — ABNORMAL HIGH (ref 50–100)
Protein, Urine: 22 mg/dL
Urine Total Volume-UPROT: 1700 mL

## 2017-09-23 LAB — GLUCOSE, CAPILLARY
GLUCOSE-CAPILLARY: 110 mg/dL — AB (ref 65–99)
Glucose-Capillary: 104 mg/dL — ABNORMAL HIGH (ref 65–99)
Glucose-Capillary: 122 mg/dL — ABNORMAL HIGH (ref 65–99)
Glucose-Capillary: 103 mg/dL — ABNORMAL HIGH (ref 65–99)

## 2017-09-23 MED ORDER — NIFEDIPINE ER OSMOTIC RELEASE 30 MG PO TB24
60.0000 mg | ORAL_TABLET | Freq: Two times a day (BID) | ORAL | Status: DC
  Administered 2017-09-23 – 2017-09-25 (×4): 60 mg via ORAL
  Filled 2017-09-23 (×5): qty 2

## 2017-09-23 MED ORDER — HYDRALAZINE HCL 20 MG/ML IJ SOLN
INTRAMUSCULAR | Status: AC
  Filled 2017-09-23: qty 1

## 2017-09-23 MED ORDER — HYDRALAZINE HCL 20 MG/ML IJ SOLN
10.0000 mg | INTRAMUSCULAR | Status: DC | PRN
  Administered 2017-09-24: 10 mg via INTRAVENOUS
  Filled 2017-09-23 (×2): qty 1

## 2017-09-23 MED ORDER — HYDRALAZINE HCL 20 MG/ML IJ SOLN
10.0000 mg | INTRAMUSCULAR | Status: DC | PRN
  Administered 2017-09-23: 10 mg via INTRAVENOUS

## 2017-09-23 NOTE — Progress Notes (Signed)
Dr. Macon Large aware of BP 180/105. Instructed to give 10 of hydralazine. Med given. Will continue to monitor. Carmelina Dane, RN

## 2017-09-23 NOTE — Progress Notes (Signed)
[place] COMPREHENSIVE PROGRESS NOTE  Amanda Meza is a 27 y.o. G1P0 at 58w1dwho [facility]  Estimated Date of Delivery: 12/08/17 Fetal presentation is cephalic.  Length of Stay:  2 Days. Admitted 09/21/2017  Subjective: Denies any headache, visual symptoms, RUQ/epigastric pain. Had received Hydralazine 10 mg IV x 1 for BP 180/105 this morning; then her usual scheduled Procardia. Patient reports good fetal movement.  She reports no uterine contractions, no bleeding and no loss of fluid per vagina.  Vitals:  Blood pressure (!) 169/103, pulse (!) 118, temperature 98.6 F (37 C), temperature source Oral, resp. rate 18, height '5\' 5"'$  (1.651 m), weight 176 lb (79.8 kg), last menstrual period 03/03/2017, SpO2 100 %.  Patient Vitals for the past 24 hrs:  BP Temp Temp src Pulse Resp SpO2  09/23/17 1000 (!) 169/103 - - - - -  09/23/17 0920 (!) 180/105 - - (!) 118 - -  09/23/17 0900 (!) 177/113 98.6 F (37 C) Oral (!) 112 18 100 %  09/23/17 0502 140/90 - - 86 18 100 %  09/23/17 0130 (!) 154/100 - - 85 18 100 %  09/22/17 2339 (!) 164/105 98.5 F (36.9 C) - (!) 103 16 100 %  09/22/17 1944 (!) 155/97 98 F (36.7 C) - (!) 105 18 100 %  09/22/17 1900 - - - - 17 -  09/22/17 1800 - - - - 18 -  09/22/17 1700 - - - - 18 -  09/22/17 1605 (!) 155/97 98.1 F (36.7 C) Oral (!) 104 - 100 %  09/22/17 1547 (!) 161/103 98.1 F (36.7 C) Oral (!) 113 17 100 %  09/22/17 1500 - - - - 18 -  09/22/17 1400 - - - - 20 -  09/22/17 1300 - - - - 18 -  09/22/17 1145 (!) 147/94 98.4 F (36.9 C) Oral (!) 101 18 99 %    Physical Examination: CONSTITUTIONAL: Well-developed, well-nourished female in no acute distress.  HENT:  Normocephalic, atraumatic, External right and left ear normal. Oropharynx is clear and moist EYES: Conjunctivae and EOM are normal. Pupils are equal, round, and reactive to light. No scleral icterus.  NECK: Normal range of  motion, supple, no masses SKIN: Skin is warm and dry. No rash noted. Not diaphoretic. No erythema. No pallor. NLolo Alert and oriented to person, place, and time. Normal reflexes, muscle tone coordination. No cranial nerve deficit noted. PSYCHIATRIC: Normal mood and affect. Normal behavior. Normal judgment and thought content. CARDIOVASCULAR: Normal heart rate noted, regular rhythm RESPIRATORY: Effort and breath sounds normal, no problems with respiration noted MUSCULOSKELETAL: Normal range of motion. No edema and no tenderness. 2+ distal pulses. ABDOMEN: Soft, nontender, nondistended, gravid. CERVIX: Dilation: Closed Exam by:: MJulianne Handler CNM  Fetal monitoring: FHR: 120 bpm, Variability: moderate, Accelerations: Present, Decelerations: Absent Uterine activity: No contractions   Results for orders placed or performed during the hospital encounter of 09/21/17 (from the past 48 hour(s))  Urinalysis, Routine w reflex microscopic     Status: Abnormal   Collection Time: 09/21/17  5:33 PM  Result Value Ref Range   Color, Urine YELLOW YELLOW   APPearance CLEAR CLEAR   Specific Gravity, Urine 1.021 1.005 - 1.030   pH 5.0 5.0 - 8.0   Glucose, UA NEGATIVE NEGATIVE mg/dL   Hgb urine dipstick NEGATIVE NEGATIVE   Bilirubin Urine NEGATIVE NEGATIVE   Ketones, ur 80 (A) NEGATIVE mg/dL   Protein, ur 100 (A) NEGATIVE  mg/dL   Nitrite NEGATIVE NEGATIVE   Leukocytes, UA MODERATE (A) NEGATIVE   RBC / HPF 0-5 0 - 5 RBC/hpf   WBC, UA 11-20 0 - 5 WBC/hpf   Bacteria, UA RARE (A) NONE SEEN   Squamous Epithelial / LPF 0-5 0 - 5    Comment: Please note change in reference range.   Mucus PRESENT     Comment: Performed at [facility], [address] [place], [place] [address]  Protein / creatinine ratio, urine     Status: Abnormal   Collection Time: 09/21/17  5:33 PM  Result Value Ref Range   Creatinine, Urine 306.00 mg/dL   Total Protein, Urine 67 mg/dL    Comment: NO NORMAL RANGE  ESTABLISHED FOR THIS TEST   Protein Creatinine Ratio 0.22 (H) 0.00 - 0.15 mg/mg[Cre]    Comment: Performed at [facility], [address], [place], [place] [address]  Type and screen [place]     Status: None   Collection Time: 09/21/17  6:40 PM  Result Value Ref Range   ABO/RH(D) A POS    Antibody Screen NEG    Sample Expiration      09/24/2017 Performed at [facility], [address] [place], [place] [address]   TSH     Status: None   Collection Time: 09/21/17  6:40 PM  Result Value Ref Range   TSH 0.853 0.350 - 4.500 uIU/mL    Comment: Performed by a 3rd Generation assay with a functional sensitivity of <=0.01 uIU/mL. Performed at [facility], [address] [place], [place] [address]   CBC     Status: Abnormal   Collection Time: 09/21/17  6:41 PM  Result Value Ref Range   WBC 9.8 4.0 - 10.5 K/uL   RBC 3.86 (L) 3.87 - 5.11 MIL/uL   Hemoglobin 12.1 12.0 - 15.0 g/dL   HCT 34.0 (L) 36.0 - 46.0 %   MCV 88.1 78.0 - 100.0 fL   MCH 31.3 26.0 - 34.0 pg   MCHC 35.6 30.0 - 36.0 g/dL   RDW 12.3 11.5 - 15.5 %   Platelets 294 150 - 400 K/uL    Comment: Performed at [facility], [address], [place], [place] [address]  Comprehensive metabolic panel     Status: Abnormal   Collection Time: 09/21/17  6:41 PM  Result Value Ref Range   Sodium 134 (L) 135 - 145 mmol/L   Potassium 3.4 (L) 3.5 - 5.1 mmol/L   Chloride 105 101 - 111 mmol/L   CO2 18 (L) 22 - 32 mmol/L   Glucose, Bld 76 65 - 99 mg/dL   BUN 9 6 - 20 mg/dL   Creatinine, Ser 0.45 0.44 - 1.00 mg/dL   Calcium 8.6 (L) 8.9 - 10.3 mg/dL   Total Protein 7.6 6.5 - 8.1 g/dL   Albumin 3.3 (L) 3.5 - 5.0 g/dL   AST 16 15 - 41 U/L   ALT 12 (L) 14 - 54 U/L   Alkaline Phosphatase 69 38 - 126 U/L   Total Bilirubin 0.6 0.3 - 1.2 mg/dL   GFR calc non Af Amer >60 >60 mL/min   GFR calc Af Amer >60 >60 mL/min    Comment: (NOTE) The eGFR has been calculated using the CKD EPI equation. This  calculation has not been validated in all clinical situations. eGFR's persistently <60 mL/min signify possible Chronic Kidney Disease.    Anion gap 11 5 - 15    Comment: Performed at  [facility], [address], [place], [place] [address]  Glucose, capillary     Status: None   Collection Time: 09/21/17  9:20 PM  Result Value Ref Range   Glucose-Capillary 94 65 - 99 mg/dL  Comprehensive metabolic panel     Status: Abnormal   Collection Time: 09/22/17  5:46 AM  Result Value Ref Range   Sodium 134 (L) 135 - 145 mmol/L   Potassium 4.1 3.5 - 5.1 mmol/L    Comment: DELTA CHECK NOTED REPEATED TO VERIFY NO VISIBLE HEMOLYSIS    Chloride 104 101 - 111 mmol/L   CO2 20 (L) 22 - 32 mmol/L   Glucose, Bld 140 (H) 65 - 99 mg/dL   BUN 10 6 - 20 mg/dL   Creatinine, Ser 0.51 0.44 - 1.00 mg/dL   Calcium 7.9 (L) 8.9 - 10.3 mg/dL   Total Protein 7.0 6.5 - 8.1 g/dL   Albumin 2.9 (L) 3.5 - 5.0 g/dL   AST 16 15 - 41 U/L   ALT 13 (L) 14 - 54 U/L   Alkaline Phosphatase 68 38 - 126 U/L   Total Bilirubin 0.4 0.3 - 1.2 mg/dL   GFR calc non Af Amer >60 >60 mL/min   GFR calc Af Amer >60 >60 mL/min    Comment: (NOTE) The eGFR has been calculated using the CKD EPI equation. This calculation has not been validated in all clinical situations. eGFR's persistently <60 mL/min signify possible Chronic Kidney Disease.    Anion gap 10 5 - 15    Comment: Performed at [facility], [address] [place], [place] [address]  CBC     Status: Abnormal   Collection Time: 09/22/17  5:46 AM  Result Value Ref Range   WBC 10.2 4.0 - 10.5 K/uL   RBC 3.74 (L) 3.87 - 5.11 MIL/uL   Hemoglobin 11.6 (L) 12.0 - 15.0 g/dL   HCT 33.2 (L) 36.0 - 46.0 %   MCV 88.8 78.0 - 100.0 fL   MCH 31.0 26.0 - 34.0 pg   MCHC 34.9 30.0 - 36.0 g/dL   RDW 12.2 11.5 - 15.5 %   Platelets 275 150 - 400 K/uL    Comment: Performed at [facility], [address], [place], [place] [address]  Protein, urine, 24 hour     Status:  Abnormal   Collection Time: 09/22/17  6:30 AM  Result Value Ref Range   Urine Total Volume-UPROT 1,700 mL    Comment: Performed at [facility], [address], [place], [place] [address]   Collection Interval-UPROT 24 hours    Comment: Performed at [facility], [address], [place], [place] [address]   Protein, Urine 22 mg/dL   Protein, 24H Urine 374 (H) 50 - 100 mg/day    Comment: Performed at [facility], [place] [address] [place], [place] [address]  Creatinine, urine, 24 hour     Status: None   Collection Time: 09/22/17  6:30 AM  Result Value Ref Range   Urine Total Volume-UCRE24 1,700 mL    Comment: Performed at [facility], [address], [place], [place] [address]   Collection Interval-UCRE24 24 hours    Comment: Performed at [facility], [place], [place] [address]   Creatinine, Urine 81.91 mg/dL   Creatinine, 24H Ur 1,392 600 - 1,800 mg/day    Comment: Performed at [facility], [place] [address], [place], [place] [address]  Glucose, capillary     Status: Abnormal  Collection Time: 09/22/17  6:17 PM  Result Value Ref Range   Glucose-Capillary 119 (H) 65 - 99 mg/dL  Glucose, capillary     Status: Abnormal   Collection Time: 09/22/17 11:45 PM  Result Value Ref Range   Glucose-Capillary 139 (H) 65 - 99 mg/dL  Glucose, capillary     Status: Abnormal   Collection Time: 09/23/17  5:51 AM  Result Value Ref Range   Glucose-Capillary 104 (H) 65 - 99 mg/dL  Glucose, capillary     Status: Abnormal   Collection Time: 09/23/17 10:33 AM  Result Value Ref Range   Glucose-Capillary 110 (H) 65 - 99 mg/dL    [place] Mfm Fetal Bpp Wo Non Stress  Result Date: 09/23/2017 ----------------------------------------------------------------------  OBSTETRICS REPORT                      (Signed Final 09/23/2017 08:16 am) ---------------------------------------------------------------------- Patient Info  ID #:       [id number]                           D.O.B.:  09-22-1990 (27 yrs)  Name:       Amanda Meza                      Visit Date: 09/23/2017 07:03 am              [place] ---------------------------------------------------------------------- Performed By  Performed By:     Hubert Azure          Ref. Address:     [facility]                                                             [place], [place]  [place]  Attending:        Griffin Dakin MD         Location:         [facility]  Referred By:      [facility] for                    [place] ---------------------------------------------------------------------- Orders   #  Description                                 Code   1  [place] MFM FETAL BPP WO NON STRESS              [address]109.60  ----------------------------------------------------------------------   #  Ordered By               Order #        Accession #    Episode #   1  Verita Schneiders           [id number]      [id number]     [id number]  ---------------------------------------------------------------------- Indications   [redacted] weeks gestation of pregnancy                Z3A.29   Hypertension - Chronic/Pre-existing            O10.019   (labetalol)  ---------------------------------------------------------------------- Fetal Evaluation  Num Of Fetuses:     1  Cardiac Activity:   Observed  Presentation:       Cephalic  Placenta:           Posterior, above cervical os  P. Cord Insertion:  Visualized  Amniotic Fluid  AFI FV:      Subjectively within normal limits  AFI Sum(cm)     %Tile       Largest Pocket(cm)  12.15           30          4.41  RUQ(cm)       RLQ(cm)       LUQ(cm)        LLQ(cm)  4.41           2.57          1.72           3.45 ---------------------------------------------------------------------- Biophysical Evaluation  Amniotic F.V:   Within normal limits       F. Tone:        Observed  F. Movement:    Observed                   Score:          8/8  F. Breathing:   Observed ---------------------------------------------------------------------- Gestational Age  Best:          29w 1d     Det. By:  U/S C R L  (05/05/17)    EDD:   12/08/17 ---------------------------------------------------------------------- Cervix Uterus Adnexa  Cervix  Appears closed, without funnelling.  Uterus  No abnormality visualized.  Left Ovary  Not visualized.  Right Ovary  Not visualized.  Adnexa:       No abnormality visualized. ---------------------------------------------------------------------- Impression  Intrauterine pregnancy at 29+1weeks with CHTN  Normal amniotic fluid  BPP 8/8 ---------------------------------------------------------------------- Recommendations  Continue clinical evaluation and management. ----------------------------------------------------------------------                 Griffin Dakin, MD Electronically Signed Final Report   09/23/2017 08:16 am ----------------------------------------------------------------------   Current scheduled medications . aspirin EC  81 mg Oral Daily  . enoxaparin (LOVENOX) injection  40 mg Subcutaneous Q24H  . NIFEdipine  60 mg Oral BID  . prenatal multivitamin  1 tablet Oral Q1200    I have reviewed the patient's current medications.  ASSESSMENT: Principal Problem:   Chronic hypertension with superimposed pre-eclampsia   PLAN: Continue Procardia (increased to 60 mg XL bid) and hydralazine prn NST bid; had BPP 10/10 today She is s/p magnesium sulfate for now and betamethasone regimen Continue to monitor GDM; DM educator consulted. No need for meds for now; slightly elevated secondary to BMZ. Continue inpatient antenatal care.   Verita Schneiders, MD,  [place] for [employer], [place]

## 2017-09-23 NOTE — Progress Notes (Signed)
Dr. Macon Large notified of BP 177/93.  Rn to follow new orders

## 2017-09-23 NOTE — Progress Notes (Signed)
Initial Nutrition Assessment  DOCUMENTATION CODES:   Not applicable  INTERVENTION:  Carbohydrate modified gestational diabetic diet Basic diet education provided  NUTRITION DIAGNOSIS:   Impaired nutrient utilization related to (r/t new diagnosis of GDM) as evidenced by (Labs).  GOAL:   Patient will meet greater than or equal to 90% of their needs MONITOR:  Labs  REASON FOR ASSESSMENT:  Antenatal, Gestational Diabetes    ASSESSMENT:   29 1/7 weeks, newly diagnosed GDM, HTN.Marland Kitchen no preconception weight recorded. Weight up 3 lbs since initial prenatal visit at 17 weeks. Reports good appetite, no nausea. Pt is aware of higher serum glucose levels assocoated with BMZ   "Meal  plan for gestational diabetics" handout given to patient.  Basic concepts reviewed.  Questions answered.  Patient verbalizes understanding.   Diet Order:   Diet Order           Diet gestational carb mod Fluid consistency: Thin; Room service appropriate? Yes  Diet effective now          EDUCATION NEEDS:   Education needs have been addressed  Skin:  Skin Assessment: Reviewed RN Assessment  Height:   Ht Readings from Last 1 Encounters:  09/21/17  (1.651 m)    Weight:   Wt Readings from Last 1 Encounters:  09/21/17 176 lb (79.8 kg)    Ideal Body Weight:   125 lbs  BMI:  Body mass index is 29.29 kg/m.  Estimated Nutritional Needs:   Kcal:  2000- 2200  Protein:  90-100 g  Fluid:  2.3 L    Elisabeth Cara M.Odis Luster LDN Neonatal Nutrition Support Specialist/RD III Pager [phone]      Phone [phone]

## 2017-09-23 NOTE — Progress Notes (Signed)
Inpatient Diabetes Program Recommendations  AACE/ADA: New Consensus Statement on Inpatient Glycemic Control (2015)  Target Ranges:  Prepandial:   less than 140 mg/dL      Peak postprandial:   less than 180 mg/dL (1-2 hours)      Critically ill patients:  140 - 180 mg/dL   Lab Results  Component Value Date   GLUCAP 110 (H) 09/23/2017    Review of Glycemic Control Results for Amanda Meza, Amanda Meza (MRN [id number]) as of 09/23/2017 15:33  Ref. Range 09/22/2017 18:17 09/22/2017 23:45 09/23/2017 05:51 09/23/2017 10:33  Glucose-Capillary Latest Ref Range: 65 - 99 mg/dL 952 (H) 841 (H) 324 (H) 110 (H)   Diabetes history: GDM Outpatient Diabetes medications: none Current orders for Inpatient glycemic control: none  Inpatient Diabetes Program Recommendations:    Noted patient has received both doses of Betamethasone, so anticipate blood sugars are increased from administration. Given current CBGs, blood sugars are acceptable even in the setting of steroids.   Spoke with patient regarding new gestational diabetes diagnosis. Patient is overwhelmed and admits to being anxious about "all of this". Reassured patient regarding plan of care and encouraged on long term goals and to reach out if necessary with concerns.  Reviewed glucose tolerance test and results of her test indicating gestational diabetes. Discussed the importance of dietary changes that would be helpful during pregnancy, such as eliminating sugary beverages and carb counting. Patient had dietitian come to help discuss counting and feels comfortable. Reviewed foods that had higher content of carbohydrate and foods that had higher nutritional value.  Educated patient on patho of GDM, relationship of hormones from placenta, need for additional insulin, risk for developing diabetes later in life, and how this correlates with overall health and associated co-morbidities. Stressed to patient the importance of finding a PCP following delivery and  establishing routine care to monitor diabetes and HTN.  Gestational diabetes book given to patient and reviewed. Hand out provided to patient on meters and test strips in the event she needs to begin checking glucose. Explained what a A1c is and what it measures. Also reviewed goal A1c with patient, importance of good glucose control @ home, and blood sugar goals. At this time, she had no further questions regarding diabetes. Will continue to watch.   Thanks, Lujean Rave, MSN, RNC-OB Diabetes Coordinator [phone] (8a-5p)

## 2017-09-24 ENCOUNTER — Encounter (HOSPITAL_COMMUNITY): Payer: Self-pay

## 2017-09-24 LAB — COMPREHENSIVE METABOLIC PANEL
ALBUMIN: 2.8 g/dL — AB (ref 3.5–5.0)
ALT: 13 U/L — AB (ref 14–54)
AST: 16 U/L (ref 15–41)
Alkaline Phosphatase: 55 U/L (ref 38–126)
Anion gap: 10 (ref 5–15)
BUN: 12 mg/dL (ref 6–20)
CHLORIDE: 105 mmol/L (ref 101–111)
CO2: 22 mmol/L (ref 22–32)
CREATININE: 0.52 mg/dL (ref 0.44–1.00)
Calcium: 8.2 mg/dL — ABNORMAL LOW (ref 8.9–10.3)
GFR calc Af Amer: >60 mL/min (ref >60)
GFR calc non Af Amer: >60 mL/min (ref >60)
GLUCOSE: 84 mg/dL (ref 65–99)
Potassium: 3.5 mmol/L (ref 3.5–5.1)
SODIUM: 137 mmol/L (ref 135–145)
Total Bilirubin: 0.4 mg/dL (ref 0.3–1.2)
Total Protein: 6.8 g/dL (ref 6.5–8.1)

## 2017-09-24 LAB — GLUCOSE, CAPILLARY
Glucose-Capillary: 82 mg/dL (ref 65–99)
Glucose-Capillary: 73 mg/dL (ref 65–99)
Glucose-Capillary: 103 mg/dL — ABNORMAL HIGH (ref 65–99)

## 2017-09-24 LAB — CBC
HCT: 34.2 % — ABNORMAL LOW (ref 36.0–46.0)
Hemoglobin: 11.5 g/dL — ABNORMAL LOW (ref 12.0–15.0)
MCH: 31.3 pg (ref 26.0–34.0)
MCHC: 33.6 g/dL (ref 30.0–36.0)
MCV: 93.2 fL (ref 78.0–100.0)
PLATELETS: 277 10*3/uL (ref 150–400)
RBC: 3.67 MIL/uL — AB (ref 3.87–5.11)
RDW: 12.5 % (ref 11.5–15.5)
WBC: 11.9 10*3/uL — ABNORMAL HIGH (ref 4.0–10.5)

## 2017-09-24 NOTE — Progress Notes (Signed)
[place] COMPREHENSIVE PROGRESS NOTE  MAYDELL KNOEBEL is a 27 y.o. G1P0 at 35w2dwho [facility]  Estimated Date of Delivery: 12/08/17 Fetal presentation is cephalic.  Length of Stay:  3 Days. Admitted 09/21/2017  Subjective: Denies any headache, visual symptoms, RUQ/epigastric pain. Received Hydralazine 10 mg IV x 2 yesterday; then her usual scheduled Procardia that was increased to bid frequency yesterday. Patient reports good fetal movement.  She reports no uterine contractions, no bleeding and no loss of fluid per vagina.  Vitals:  Blood pressure (!) 155/92, pulse (!) 107, temperature 99.1 F (37.3 C), temperature source Oral, resp. rate 18, height '5\' 5"'$  (1.651 m), weight 176 lb (79.8 kg), last menstrual period 03/03/2017, SpO2 100 %.  Patient Vitals for the past 24 hrs:  BP Temp Temp src Pulse Resp SpO2  09/24/17 0733 (!) 155/92 99.1 F (37.3 C) Oral (!) 107 18 100 %  09/24/17 0514 (!) 143/76 98.1 F (36.7 C) Oral 82 18 100 %  09/23/17 2250 (!) 156/88 - - 93 - -  09/23/17 2242 (!) 172/96 98.4 F (36.9 C) - (!) 108 17 100 %  09/23/17 2000 (!) 150/96 - - - - -  09/23/17 1956 (!) 150/101 98.4 F (36.9 C) - 99 16 100 %  09/23/17 1613 (!) 168/98 - - (!) 108 - -  09/23/17 1549 (!) 177/93 99.1 F (37.3 C) Oral (!) 113 18 100 %  09/23/17 1154 (!) 164/98 97.9 F (36.6 C) Oral (!) 105 - 99 %  09/23/17 1142 (!) 164/98 - - 98 18 100 %    Physical Examination: CONSTITUTIONAL: Well-developed, well-nourished female in no acute distress.  HENT:  Normocephalic, atraumatic, External right and left ear normal. Oropharynx is clear and moist EYES: Conjunctivae and EOM are normal. Pupils are equal, round, and reactive to light. No scleral icterus.  NECK: Normal range of motion, supple, no masses SKIN: Skin is warm and dry. No rash noted. Not diaphoretic. No erythema. No pallor. [place] Alert and oriented to person, place,  and time. Normal reflexes, muscle tone coordination. No cranial nerve deficit noted. PSYCHIATRIC: Normal mood and affect. Normal behavior. Normal judgment and thought content. CARDIOVASCULAR: Normal heart rate noted, regular rhythm RESPIRATORY: Effort and breath sounds normal, no problems with respiration noted MUSCULOSKELETAL: Normal range of motion. No edema and no tenderness. 2+ distal pulses. ABDOMEN: Soft, nontender, nondistended, gravid. CERVIX: Dilation: Closed Exam by:: MJulianne Handler CNM  Fetal monitoring: FHR: 120 bpm, Variability: moderate, Accelerations: Present, Decelerations: Absent x 2 Uterine activity: No contractions   Results for orders placed or performed during the hospital encounter of 09/21/17 (from the past 72 hour(s))  Urinalysis, Routine w reflex microscopic     Status: Abnormal   Collection Time: 09/21/17  5:33 PM  Result Value Ref Range   Color, Urine YELLOW YELLOW   APPearance CLEAR CLEAR   Specific Gravity, Urine 1.021 1.005 - 1.030   pH 5.0 5.0 - 8.0   Glucose, UA NEGATIVE NEGATIVE mg/dL   Hgb urine dipstick NEGATIVE NEGATIVE   Bilirubin Urine NEGATIVE NEGATIVE   Ketones, ur 80 (A) NEGATIVE mg/dL   Protein, ur 100 (A) NEGATIVE mg/dL   Nitrite NEGATIVE NEGATIVE   Leukocytes, UA MODERATE (A) NEGATIVE   RBC / HPF 0-5 0 - 5 RBC/hpf   WBC, UA 11-20 0 - 5 WBC/hpf   Bacteria, UA RARE (A) NONE SEEN   Squamous Epithelial / LPF 0-5 0 - 5  Comment: Please note change in reference range.   Mucus PRESENT     Comment: Performed at [facility], [address], [place], [place] [address]  Protein / creatinine ratio, urine     Status: Abnormal   Collection Time: 09/21/17  5:33 PM  Result Value Ref Range   Creatinine, Urine 306.00 mg/dL   Total Protein, Urine 67 mg/dL    Comment: NO NORMAL RANGE ESTABLISHED FOR THIS TEST   Protein Creatinine Ratio 0.22 (H) 0.00 - 0.15 mg/mg[Cre]    Comment: Performed at [facility], [address] [place],  [place] [address]  Type and screen [place]     Status: None   Collection Time: 09/21/17  6:40 PM  Result Value Ref Range   ABO/RH(D) A POS    Antibody Screen NEG    Sample Expiration      09/24/2017 Performed at [facility], [address], [place], [place] [address]   TSH     Status: None   Collection Time: 09/21/17  6:40 PM  Result Value Ref Range   TSH 0.853 0.350 - 4.500 uIU/mL    Comment: Performed by a 3rd Generation assay with a functional sensitivity of <=0.01 uIU/mL. Performed at [facility], [address] [place], [place] [address]   CBC     Status: Abnormal   Collection Time: 09/21/17  6:41 PM  Result Value Ref Range   WBC 9.8 4.0 - 10.5 K/uL   RBC 3.86 (L) 3.87 - 5.11 MIL/uL   Hemoglobin 12.1 12.0 - 15.0 g/dL   HCT 34.0 (L) 36.0 - 46.0 %   MCV 88.1 78.0 - 100.0 fL   MCH 31.3 26.0 - 34.0 pg   MCHC 35.6 30.0 - 36.0 g/dL   RDW 12.3 11.5 - 15.5 %   Platelets 294 150 - 400 K/uL    Comment: Performed at [facility], [address] [place], [place] [address]  Comprehensive metabolic panel     Status: Abnormal   Collection Time: 09/21/17  6:41 PM  Result Value Ref Range   Sodium 134 (L) 135 - 145 mmol/L   Potassium 3.4 (L) 3.5 - 5.1 mmol/L   Chloride 105 101 - 111 mmol/L   CO2 18 (L) 22 - 32 mmol/L   Glucose, Bld 76 65 - 99 mg/dL   BUN 9 6 - 20 mg/dL   Creatinine, Ser 0.45 0.44 - 1.00 mg/dL   Calcium 8.6 (L) 8.9 - 10.3 mg/dL   Total Protein 7.6 6.5 - 8.1 g/dL   Albumin 3.3 (L) 3.5 - 5.0 g/dL   AST 16 15 - 41 U/L   ALT 12 (L) 14 - 54 U/L   Alkaline Phosphatase 69 38 - 126 U/L   Total Bilirubin 0.6 0.3 - 1.2 mg/dL   GFR calc non Af Amer >60 >60 mL/min   GFR calc Af Amer >60 >60 mL/min    Comment: (NOTE) The eGFR has been calculated using the CKD EPI equation. This calculation has not been validated in all clinical situations. eGFR's persistently <60 mL/min signify possible Chronic Kidney Disease.    Anion gap 11 5 - 15     Comment: Performed at [facility], [place],  [address]  Glucose, capillary     Status: None   Collection Time: 09/21/17  9:20 PM  Result Value Ref Range   Glucose-Capillary 94 65 - 99 mg/dL  Comprehensive metabolic panel     Status: Abnormal   Collection Time:  09/22/17  5:46 AM  Result Value Ref Range   Sodium 134 (L) 135 - 145 mmol/L   Potassium 4.1 3.5 - 5.1 mmol/L    Comment: DELTA CHECK NOTED REPEATED TO VERIFY NO VISIBLE HEMOLYSIS    Chloride 104 101 - 111 mmol/L   CO2 20 (L) 22 - 32 mmol/L   Glucose, Bld 140 (H) 65 - 99 mg/dL   BUN 10 6 - 20 mg/dL   Creatinine, Ser 0.51 0.44 - 1.00 mg/dL   Calcium 7.9 (L) 8.9 - 10.3 mg/dL   Total Protein 7.0 6.5 - 8.1 g/dL   Albumin 2.9 (L) 3.5 - 5.0 g/dL   AST 16 15 - 41 U/L   ALT 13 (L) 14 - 54 U/L   Alkaline Phosphatase 68 38 - 126 U/L   Total Bilirubin 0.4 0.3 - 1.2 mg/dL   GFR calc non Af Amer >60 >60 mL/min   GFR calc Af Amer >60 >60 mL/min    Comment: (NOTE) The eGFR has been calculated using the CKD EPI equation. This calculation has not been validated in all clinical situations. eGFR's persistently <60 mL/min signify possible Chronic Kidney Disease.    Anion gap 10 5 - 15    Comment: Performed at [facility], [address], [place], [place] [address]  CBC     Status: Abnormal   Collection Time: 09/22/17  5:46 AM  Result Value Ref Range   WBC 10.2 4.0 - 10.5 K/uL   RBC 3.74 (L) 3.87 - 5.11 MIL/uL   Hemoglobin 11.6 (L) 12.0 - 15.0 g/dL   HCT 33.2 (L) 36.0 - 46.0 %   MCV 88.8 78.0 - 100.0 fL   MCH 31.0 26.0 - 34.0 pg   MCHC 34.9 30.0 - 36.0 g/dL   RDW 12.2 11.5 - 15.5 %   Platelets 275 150 - 400 K/uL    Comment: Performed at [facility], [address] [place], [place] [address]  Protein, urine, 24 hour     Status: Abnormal   Collection Time: 09/22/17  6:30 AM  Result Value Ref Range   Urine Total Volume-UPROT 1,700 mL    Comment: Performed at [facility], [address] [place], [place] [address]   Collection Interval-UPROT 24 hours    Comment: Performed at [facility] [address] [place], [place] [address]   Protein, Urine 22 mg/dL   Protein, 24H Urine 374 (H) 50 - 100 mg/day    Comment: Performed at [facility], [place] [address] [place], [place] [address]  Creatinine, urine, 24 hour     Status: None   Collection Time: 09/22/17  6:30 AM  Result Value Ref Range   Urine Total Volume-UCRE24 1,700 mL    Comment: Performed at [facility], [address], [place], [place] [address]   Collection Interval-UCRE24 24 hours    Comment: Performed at [facility], [place], [place] [address]   Creatinine, Urine 81.91 mg/dL   Creatinine, 24H Ur 1,392 600 - 1,800 mg/day    Comment: Performed at [facility], [place] [address] [place], [place] [address]  Glucose, capillary     Status: Abnormal   Collection Time: 09/22/17  6:17 PM  Result Value Ref Range   Glucose-Capillary 119 (H) 65 - 99 mg/dL  Glucose, capillary     Status: Abnormal   Collection Time: 09/22/17 11:45 PM  Result Value Ref Range   Glucose-Capillary 139 (H) 65 - 99 mg/dL  Glucose, capillary  Status: Abnormal   Collection Time: 09/23/17  5:51 AM  Result Value Ref Range   Glucose-Capillary 104 (H) 65 - 99 mg/dL  Glucose, capillary     Status: Abnormal   Collection Time: 09/23/17 10:33 AM  Result Value Ref Range   Glucose-Capillary 110 (H) 65 - 99 mg/dL  Glucose, capillary     Status: Abnormal   Collection Time: 09/23/17  3:48 PM  Result Value Ref Range   Glucose-Capillary 122 (H) 65 - 99 mg/dL  Glucose, capillary     Status: Abnormal   Collection Time: 09/23/17 10:45 PM  Result Value Ref Range   Glucose-Capillary 103 (H) 65 - 99 mg/dL  Glucose, capillary     Status: None   Collection Time: 09/24/17  5:15 AM  Result Value Ref Range   Glucose-Capillary 82 65 - 99 mg/dL  Comprehensive metabolic panel     Status: Abnormal   Collection Time:  09/24/17  5:54 AM  Result Value Ref Range   Sodium 137 135 - 145 mmol/L   Potassium 3.5 3.5 - 5.1 mmol/L   Chloride 105 101 - 111 mmol/L   CO2 22 22 - 32 mmol/L   Glucose, Bld 84 65 - 99 mg/dL   BUN 12 6 - 20 mg/dL   Creatinine, Ser 0.52 0.44 - 1.00 mg/dL   Calcium 8.2 (L) 8.9 - 10.3 mg/dL   Total Protein 6.8 6.5 - 8.1 g/dL   Albumin 2.8 (L) 3.5 - 5.0 g/dL   AST 16 15 - 41 U/L   ALT 13 (L) 14 - 54 U/L   Alkaline Phosphatase 55 38 - 126 U/L   Total Bilirubin 0.4 0.3 - 1.2 mg/dL   GFR calc non Af Amer >60 >60 mL/min   GFR calc Af Amer >60 >60 mL/min    Comment: (NOTE) The eGFR has been calculated using the CKD EPI equation. This calculation has not been validated in all clinical situations. eGFR's persistently <60 mL/min signify possible Chronic Kidney Disease.    Anion gap 10 5 - 15    Comment: Performed at [facility], [address], [place], [place] [address]  CBC     Status: Abnormal   Collection Time: 09/24/17  5:54 AM  Result Value Ref Range   WBC 11.9 (H) 4.0 - 10.5 K/uL   RBC 3.67 (L) 3.87 - 5.11 MIL/uL   Hemoglobin 11.5 (L) 12.0 - 15.0 g/dL   HCT 34.2 (L) 36.0 - 46.0 %   MCV 93.2 78.0 - 100.0 fL   MCH 31.3 26.0 - 34.0 pg   MCHC 33.6 30.0 - 36.0 g/dL   RDW 12.5 11.5 - 15.5 %   Platelets 277 150 - 400 K/uL    Comment: Performed at [facility], [address] [place], [place] [address]      [place] Mfm Fetal Bpp Wo Non Stress  Result Date: 09/23/2017 ----------------------------------------------------------------------  OBSTETRICS REPORT                      (Signed Final 09/23/2017 08:16 am) ---------------------------------------------------------------------- Patient Info  ID #:       [id number]                          D.O.B.:  August 23, 1990 (27 yrs)  Name:       Golda Acre                      Visit Date:  09/23/2017 07:03 am              [place] ---------------------------------------------------------------------- Performed By  Performed By:     Hubert Azure          Ref. Address:     [facility]                                                             [place]                                                             [place], [place]  Attending:        Griffin Dakin MD         Location:         [facility]  Referred By:      [facility] for                    [place] ---------------------------------------------------------------------- Orders   #  Description                                 Code   1  [place] MFM FETAL BPP WO NON STRESS              68115.72  ----------------------------------------------------------------------   #  Ordered By               Order #        Accession #    Episode #   1  Verita Schneiders           [id number]  [id number]     [id number]  ---------------------------------------------------------------------- Indications   [redacted] weeks gestation of pregnancy                Z3A.29   Hypertension - Chronic/Pre-existing            O10.019   (labetalol)  ---------------------------------------------------------------------- Fetal Evaluation  Num Of Fetuses:     1  Cardiac Activity:   Observed  Presentation:       Cephalic  Placenta:           Posterior, above cervical os  P. Cord Insertion:  Visualized  Amniotic Fluid  AFI FV:      Subjectively within normal limits  AFI Sum(cm)     %Tile       Largest Pocket(cm)  12.15           30          4.41  RUQ(cm)       RLQ(cm)       LUQ(cm)        LLQ(cm)  4.41          2.57          1.72           3.45 ---------------------------------------------------------------------- Biophysical Evaluation  Amniotic F.V:   Within normal limits       F. Tone:        Observed  F. Movement:    Observed                    Score:          8/8  F. Breathing:   Observed ---------------------------------------------------------------------- Gestational Age  Best:          29w 1d     Det. By:  U/S C R L  (05/05/17)    EDD:   12/08/17 ---------------------------------------------------------------------- Cervix Uterus Adnexa  Cervix  Appears closed, without funnelling.  Uterus  No abnormality visualized.  Left Ovary  Not visualized.  Right Ovary  Not visualized.  Adnexa:       No abnormality visualized. ---------------------------------------------------------------------- Impression  Intrauterine pregnancy at 29+1weeks with CHTN  Normal amniotic fluid  BPP 8/8 ---------------------------------------------------------------------- Recommendations  Continue clinical evaluation and management. ----------------------------------------------------------------------                 Griffin Dakin, MD Electronically Signed Final Report   09/23/2017 08:16 am ----------------------------------------------------------------------   Current scheduled medications . aspirin EC  81 mg Oral Daily  . enoxaparin (LOVENOX) injection  40 mg Subcutaneous Q24H  . NIFEdipine  60 mg Oral BID  . prenatal multivitamin  1 tablet Oral Q1200    I have reviewed the patient's current medications.  ASSESSMENT: Principal Problem:   Chronic hypertension with superimposed pre-eclampsia  PLAN: BP trend is improved; continue Procardia (increased to 60 mg XL bid) for now and hydralazine prn Continue NST bid; had BPP 10/10 on 09/23/17 She is s/p magnesium sulfate for now and betamethasone regimen CBGs stable.  Continue to monitor GDM; no need for meds for now. S/p DM education. Continue inpatient antenatal care.   Verita Schneiders, MD, [place] for [employer], [place]

## 2017-09-25 LAB — GLUCOSE, CAPILLARY: GLUCOSE-CAPILLARY: 77 mg/dL (ref 65–99)

## 2017-09-25 MED ORDER — NIFEDIPINE ER 60 MG PO TB24: 60.0000 mg | ORAL_TABLET | Freq: Two times a day (BID) | ORAL | 2 refills | Status: DC

## 2017-09-25 NOTE — Progress Notes (Signed)
Discharge teaching complete with pt. Pt understood all information and did not have any questions. Pt discharged home to family. 

## 2017-09-25 NOTE — Discharge Instructions (Signed)
Preeclampsia and Eclampsia °Preeclampsia is a serious condition that develops only during pregnancy. It is also called toxemia of pregnancy. This condition causes high blood pressure along with other symptoms, such as swelling and headaches. These symptoms may develop as the condition gets worse. Preeclampsia may occur at 20 weeks of pregnancy or later. °Diagnosing and treating preeclampsia early is very important. If not treated early, it can cause serious problems for you and your baby. One problem it can lead to is eclampsia, which is a condition that causes muscle jerking or shaking (convulsions or seizures) in the mother. Delivering your baby is the best treatment for preeclampsia or eclampsia. Preeclampsia and eclampsia symptoms usually go away after your baby is born. °What are the causes? °The cause of preeclampsia is not known. °What increases the risk? °The following risk factors make you more likely to develop preeclampsia: °· Being pregnant for the first time. °· Having had preeclampsia during a past pregnancy. °· Having a family history of preeclampsia. °· Having high blood pressure. °· Being pregnant with twins or triplets. °· Being 35 or older. °· Being African-American. °· Having kidney disease or diabetes. °· Having medical conditions such as lupus or blood diseases. °· Being very overweight (obese). ° °What are the signs or symptoms? °The earliest signs of preeclampsia are: °· High blood pressure. °· Increased protein in your urine. Your health care provider will check for this at every visit before you give birth (prenatal visit). ° °Other symptoms that may develop as the condition gets worse include: °· Severe headaches. °· Sudden weight gain. °· Swelling of the hands, face, legs, and feet. °· Nausea and vomiting. °· Vision problems, such as blurred or double vision. °· Numbness in the face, arms, legs, and feet. °· Urinating less than usual. °· Dizziness. °· Slurred speech. °· Abdominal pain,  especially upper abdominal pain. °· Convulsions or seizures. ° °Symptoms generally go away after giving birth. °How is this diagnosed? °There are no screening tests for preeclampsia. Your health care provider will ask you about symptoms and check for signs of preeclampsia during your prenatal visits. You may also have tests that include: °· Urine tests. °· Blood tests. °· Checking your blood pressure. °· Monitoring your baby’s heart rate. °· Ultrasound. ° °How is this treated? °You and your health care provider will determine the treatment approach that is best for you. Treatment may include: °· Having more frequent prenatal exams to check for signs of preeclampsia, if you have an increased risk for preeclampsia. °· Bed rest. °· Reducing how much salt (sodium) you eat. °· Medicine to lower your blood pressure. °· Staying in the hospital, if your condition is severe. There, treatment will focus on controlling your blood pressure and the amount of fluids in your body (fluid retention). °· You may need to take medicine (magnesium sulfate) to prevent seizures. This medicine may be given as an injection or through an IV tube. °· Delivering your baby early, if your condition gets worse. You may have your labor started with medicine (induced), or you may have a cesarean delivery. ° °Follow these instructions at home: °Eating and drinking ° °· Drink enough fluid to keep your urine clear or pale yellow. °· Eat a healthy diet that is low in sodium. Do not add salt to your food. Check nutrition labels to see how much sodium a food or beverage contains. °· Avoid caffeine. °Lifestyle °· Do not use any products that contain nicotine or tobacco, such as cigarettes   and e-cigarettes. If you need help quitting, ask your health care provider. °· Do not use alcohol or drugs. °· Avoid stress as much as possible. Rest and get plenty of sleep. °General instructions °· Take over-the-counter and prescription medicines only as told by your  health care provider. °· When lying down, lie on your side. This keeps pressure off of your baby. °· When sitting or lying down, raise (elevate) your feet. Try putting some pillows underneath your lower legs. °· Exercise regularly. Ask your health care provider what kinds of exercise are best for you. °· Keep all follow-up and prenatal visits as told by your health care provider. This is important. °How is this prevented? °To prevent preeclampsia or eclampsia from developing during another pregnancy: °· Get proper medical care during pregnancy. Your health care provider may be able to prevent preeclampsia or diagnose and treat it early. °· Your health care provider may have you take a low-dose aspirin or a calcium supplement during your next pregnancy. °· You may have tests of your blood pressure and kidney function after giving birth. °· Maintain a healthy weight. Ask your health care provider for help managing weight gain during pregnancy. °· Work with your health care provider to manage any long-term (chronic) health conditions you have, such as diabetes or kidney problems. ° °Contact a health care provider if: °· You gain more weight than expected. °· You have headaches. °· You have nausea or vomiting. °· You have abdominal pain. °· You feel dizzy or light-headed. °Get help right away if: °· You develop sudden or severe swelling anywhere in your body. This usually happens in the legs. °· You gain 5 lbs (2.3 kg) or more during one week. °· You have severe: °? Abdominal pain. °? Headaches. °? Dizziness. °? Vision problems. °? Confusion. °? Nausea or vomiting. °· You have a seizure. °· You have trouble moving any part of your body. °· You develop numbness in any part of your body. °· You have trouble speaking. °· You have any abnormal bleeding. °· You pass out. °This information is not intended to replace advice given to you by your health care provider. Make sure you discuss any questions you have with your health  care provider. °Document Released: 05/02/2000 Document Revised: 01/01/2016 Document Reviewed: 12/10/2015 °[employer]vier Interactive Patient Education © 2018 [employer] ° °

## 2017-09-25 NOTE — Discharge Summary (Signed)
Antenatal Physician Discharge Summary  Patient ID: Amanda Meza MRN: [id number] DOB/AGE: March 07, 1991 27 y.o.  Admit date: 09/21/2017 Discharge date: 09/25/2017  Admission Diagnoses: Chronic hypertension with superimposed preeclampsia and gestational diabetes in third trimester   Discharge Diagnoses: The same  Prenatal Procedures: NST and ultrasound  Consults: Neonatology, Maternal Fetal Medicine  Hospital Course:  This is a 27 y.o. G1P0 with IUP at [redacted]w[redacted]d admitted for surveillance for worsening, severe range BP in the setting of known CHTN. No other preeclampsia symptoms  She received multiple doses of IV antihypertensives and was started on magnesium sulfate for eclampsia prophylaxis. She remained on this for almost 48 hours, and her BP regimen was transitioned to oral Procardia XR. This was titrated to the maximum dosage of 60 mg bid which helped control her BP. She had no neurologic symptoms, no upper abdominal pain and her labs other than a 24 hour urine protein that was 374 mg, were unremarkable. She received betamethasone x 2 doses.  The fetal heart rate monitoring remained reassuring, and she had no signs/symptoms of progressing preterm labor or other maternal-fetal concerns.  Of note, patient had a growth scan on 09/16/2017 that showed EFW 39% with normal AFV and normal anatomy. BPP on 09/23/2017 was 8/8. Her blood sugars were initially elevated s/p betamethsone but stabilized, and she received DM teaching for the educator/coordinator. She was deemed stable for discharge to home with close outpatient follow up on 09/25/2017. Patient understands that delivery would be indicated for superimposed severe preeclampsia at 34 weeks; but the delivery time could be farther out if her BP is able to be adequately controlled  Discharge Exam: Temp:  [98.6 F (37 C)-98.7 F (37.1 C)] 98.7 F (37.1 C) (05/10 0749) Pulse Rate:  [86-120] 108 (05/10 0809) Resp:  [18-22] 18 (05/10 0749) BP:  (144-167)/(75-97) 158/89 (05/10 0915) SpO2:  [98 %-100 %] 100 % (05/10 0749) Physical Examination: CONSTITUTIONAL: Well-developed, well-nourished female in no acute distress.  NEUROLGIC: Alert and oriented to person, place, and time. Normal reflexes, muscle tone coordination. No cranial nerve deficit noted. PSYCHIATRIC: Normal mood and affect. Normal behavior. Normal judgment and thought content. CARDIOVASCULAR: Normal heart rate noted, regular rhythm RESPIRATORY: Effort and breath sounds normal, no problems with respiration noted MUSCULOSKELETAL: Normal range of motion. No edema and no tenderness. 2+ distal pulses. ABDOMEN: Soft, nontender, nondistended, gravid. CERVIX: Dilation: Closed Exam by:: Donette Larry, CNM  Fetal monitoring: FHR: 140 bpm, Variability: moderate, Accelerations: Present, Decelerations: Absent  Uterine activity: Quiet  Significant Diagnostic Studies:  Results for orders placed or performed during the hospital encounter of 09/21/17 (from the past 168 hour(s))  Urinalysis, Routine w reflex microscopic   Collection Time: 09/21/17  5:33 PM  Result Value Ref Range   Color, Urine YELLOW YELLOW   APPearance CLEAR CLEAR   Specific Gravity, Urine 1.021 1.005 - 1.030   pH 5.0 5.0 - 8.0   Glucose, UA NEGATIVE NEGATIVE mg/dL   Hgb urine dipstick NEGATIVE NEGATIVE   Bilirubin Urine NEGATIVE NEGATIVE   Ketones, ur 80 (A) NEGATIVE mg/dL   Protein, ur 409 (A) NEGATIVE mg/dL   Nitrite NEGATIVE NEGATIVE   Leukocytes, UA MODERATE (A) NEGATIVE   RBC / HPF 0-5 0 - 5 RBC/hpf   WBC, UA 11-20 0 - 5 WBC/hpf   Bacteria, UA RARE (A) NONE SEEN   Squamous Epithelial / LPF 0-5 0 - 5   Mucus PRESENT   Protein / creatinine ratio, urine   Collection Time: 09/21/17  5:33 PM  Result Value Ref Range   Creatinine, Urine 306.00 mg/dL   Total Protein, Urine 67 mg/dL   Protein Creatinine Ratio 0.22 (H) 0.00 - 0.15 mg/mg[Cre]  TSH   Collection Time: 09/21/17  6:40 PM  Result Value Ref  Range   TSH 0.853 0.350 - 4.500 uIU/mL  Type and screen [facility]   Collection Time: 09/21/17  6:40 PM  Result Value Ref Range   ABO/RH(D) A POS    Antibody Screen NEG    Sample Expiration      09/24/2017 Performed at [facility], [address] [place], [place] [address]   CBC   Collection Time: 09/21/17  6:41 PM  Result Value Ref Range   WBC 9.8 4.0 - 10.5 K/uL   RBC 3.86 (L) 3.87 - 5.11 MIL/uL   Hemoglobin 12.1 12.0 - 15.0 g/dL   HCT 60.4 (L) 54.0 - 98.1 %   MCV 88.1 78.0 - 100.0 fL   MCH 31.3 26.0 - 34.0 pg   MCHC 35.6 30.0 - 36.0 g/dL   RDW 19.1 47.8 - 29.5 %   Platelets 294 150 - 400 K/uL  Comprehensive metabolic panel   Collection Time: 09/21/17  6:41 PM  Result Value Ref Range   Sodium 134 (L) 135 - 145 mmol/L   Potassium 3.4 (L) 3.5 - 5.1 mmol/L   Chloride 105 101 - 111 mmol/L   CO2 18 (L) 22 - 32 mmol/L   Glucose, Bld 76 65 - 99 mg/dL   BUN 9 6 - 20 mg/dL   Creatinine, Ser 6.21 0.44 - 1.00 mg/dL   Calcium 8.6 (L) 8.9 - 10.3 mg/dL   Total Protein 7.6 6.5 - 8.1 g/dL   Albumin 3.3 (L) 3.5 - 5.0 g/dL   AST 16 15 - 41 U/L   ALT 12 (L) 14 - 54 U/L   Alkaline Phosphatase 69 38 - 126 U/L   Total Bilirubin 0.6 0.3 - 1.2 mg/dL   GFR calc non Af Amer >60 >60 mL/min   GFR calc Af Amer >60 >60 mL/min   Anion gap 11 5 - 15  Glucose, capillary   Collection Time: 09/21/17  9:20 PM  Result Value Ref Range   Glucose-Capillary 94 65 - 99 mg/dL  Comprehensive metabolic panel   Collection Time: 09/22/17  5:46 AM  Result Value Ref Range   Sodium 134 (L) 135 - 145 mmol/L   Potassium 4.1 3.5 - 5.1 mmol/L   Chloride 104 101 - 111 mmol/L   CO2 20 (L) 22 - 32 mmol/L   Glucose, Bld 140 (H) 65 - 99 mg/dL   BUN 10 6 - 20 mg/dL   Creatinine, Ser 3.08 0.44 - 1.00 mg/dL   Calcium 7.9 (L) 8.9 - 10.3 mg/dL   Total Protein 7.0 6.5 - 8.1 g/dL   Albumin 2.9 (L) 3.5 - 5.0 g/dL   AST 16 15 - 41 U/L   ALT 13 (L) 14 - 54 U/L   Alkaline Phosphatase 68 38 - 126  U/L   Total Bilirubin 0.4 0.3 - 1.2 mg/dL   GFR calc non Af Amer >60 >60 mL/min   GFR calc Af Amer >60 >60 mL/min   Anion gap 10 5 - 15  CBC   Collection Time: 09/22/17  5:46 AM  Result Value Ref Range   WBC 10.2 4.0 - 10.5 K/uL   RBC 3.74 (L) 3.87 - 5.11 MIL/uL   Hemoglobin 11.6 (L) 12.0 - 15.0 g/dL   HCT 65.7 (  L) 36.0 - 46.0 %   MCV 88.8 78.0 - 100.0 fL   MCH 31.0 26.0 - 34.0 pg   MCHC 34.9 30.0 - 36.0 g/dL   RDW 16.1 09.6 - 04.5 %   Platelets 275 150 - 400 K/uL  Protein, urine, 24 hour   Collection Time: 09/22/17  6:30 AM  Result Value Ref Range   Urine Total Volume-UPROT 1,700 mL   Collection Interval-UPROT 24 hours   Protein, Urine 22 mg/dL   Protein, 40J Urine 811 (H) 50 - 100 mg/day  Creatinine, urine, 24 hour   Collection Time: 09/22/17  6:30 AM  Result Value Ref Range   Urine Total Volume-UCRE24 1,700 mL   Collection Interval-UCRE24 24 hours   Creatinine, Urine 81.91 mg/dL   Creatinine, 91Y Ur 7,829 600 - 1,800 mg/day  Glucose, capillary   Collection Time: 09/22/17  6:17 PM  Result Value Ref Range   Glucose-Capillary 119 (H) 65 - 99 mg/dL  Glucose, capillary   Collection Time: 09/22/17 11:45 PM  Result Value Ref Range   Glucose-Capillary 139 (H) 65 - 99 mg/dL  Glucose, capillary   Collection Time: 09/23/17  5:51 AM  Result Value Ref Range   Glucose-Capillary 104 (H) 65 - 99 mg/dL  Glucose, capillary   Collection Time: 09/23/17 10:33 AM  Result Value Ref Range   Glucose-Capillary 110 (H) 65 - 99 mg/dL  Glucose, capillary   Collection Time: 09/23/17  3:48 PM  Result Value Ref Range   Glucose-Capillary 122 (H) 65 - 99 mg/dL  Glucose, capillary   Collection Time: 09/23/17 10:45 PM  Result Value Ref Range   Glucose-Capillary 103 (H) 65 - 99 mg/dL  Glucose, capillary   Collection Time: 09/24/17  5:15 AM  Result Value Ref Range   Glucose-Capillary 82 65 - 99 mg/dL  Comprehensive metabolic panel   Collection Time: 09/24/17  5:54 AM  Result Value Ref Range    Sodium 137 135 - 145 mmol/L   Potassium 3.5 3.5 - 5.1 mmol/L   Chloride 105 101 - 111 mmol/L   CO2 22 22 - 32 mmol/L   Glucose, Bld 84 65 - 99 mg/dL   BUN 12 6 - 20 mg/dL   Creatinine, Ser 5.62 0.44 - 1.00 mg/dL   Calcium 8.2 (L) 8.9 - 10.3 mg/dL   Total Protein 6.8 6.5 - 8.1 g/dL   Albumin 2.8 (L) 3.5 - 5.0 g/dL   AST 16 15 - 41 U/L   ALT 13 (L) 14 - 54 U/L   Alkaline Phosphatase 55 38 - 126 U/L   Total Bilirubin 0.4 0.3 - 1.2 mg/dL   GFR calc non Af Amer >60 >60 mL/min   GFR calc Af Amer >60 >60 mL/min   Anion gap 10 5 - 15  CBC   Collection Time: 09/24/17  5:54 AM  Result Value Ref Range   WBC 11.9 (H) 4.0 - 10.5 K/uL   RBC 3.67 (L) 3.87 - 5.11 MIL/uL   Hemoglobin 11.5 (L) 12.0 - 15.0 g/dL   HCT 13.0 (L) 86.5 - 78.4 %   MCV 93.2 78.0 - 100.0 fL   MCH 31.3 26.0 - 34.0 pg   MCHC 33.6 30.0 - 36.0 g/dL   RDW 69.6 29.5 - 28.4 %   Platelets 277 150 - 400 K/uL  Glucose, capillary   Collection Time: 09/24/17  2:25 PM  Result Value Ref Range   Glucose-Capillary 73 65 - 99 mg/dL  Glucose, capillary   Collection Time: 09/24/17 10:08  PM  Result Value Ref Range   Glucose-Capillary 103 (H) 65 - 99 mg/dL  Glucose, capillary   Collection Time: 09/25/17  6:15 AM  Result Value Ref Range   Glucose-Capillary 77 65 - 99 mg/dL    [place] Mfm Fetal Bpp Wo Non Stress  Result Date: 09/23/2017 ----------------------------------------------------------------------  OBSTETRICS REPORT                      (Signed Final 09/23/2017 08:16 am) ---------------------------------------------------------------------- Patient Info  ID #:       [id number]                          D.O.B.:  1991-04-28 (27 yrs)  Name:       Bethann Punches                      Visit Date: 09/23/2017 07:03 am              [place] ---------------------------------------------------------------------- Performed By  Performed By:     Hurman Horn          Ref. Address:     [facility]                                                              OB/Gyn Clinic                                                             [address]                                                             [place], [place]                                                             [address]  Attending:        Charlsie Merles MD         Location:         [facility]  Referred By:      [facility] for  Women's                    Healthcare ---------------------------------------------------------------------- Orders   #  Description                                 Code   1  [place] MFM FETAL BPP WO NON STRESS              [id number]  ----------------------------------------------------------------------   #  Ordered By               Order #        Accession #    Episode #   1  Jaynie Collins           [id number]      [id number]     [id number]  ---------------------------------------------------------------------- Indications   [redacted] weeks gestation of pregnancy                Z3A.29   Hypertension - Chronic/Pre-existing            O10.019   (labetalol)  ---------------------------------------------------------------------- Fetal Evaluation  Num Of Fetuses:     1  Cardiac Activity:   Observed  Presentation:       Cephalic  Placenta:           Posterior, above cervical os  P. Cord Insertion:  Visualized  Amniotic Fluid  AFI FV:      Subjectively within normal limits  AFI Sum(cm)     %Tile       Largest Pocket(cm)  12.15           30          4.41  RUQ(cm)       RLQ(cm)       LUQ(cm)        LLQ(cm)  4.41          2.57          1.72           3.45 ---------------------------------------------------------------------- Biophysical Evaluation  Amniotic F.V:   Within normal limits       F. Tone:        Observed  F. Movement:    Observed                   Score:          8/8  F. Breathing:   Observed  ---------------------------------------------------------------------- Gestational Age  Best:          29w 1d     Det. By:  U/S C R L  (05/05/17)    EDD:   12/08/17 ---------------------------------------------------------------------- Cervix Uterus Adnexa  Cervix  Appears closed, without funnelling.  Uterus  No abnormality visualized.  Left Ovary  Not visualized.  Right Ovary  Not visualized.  Adnexa:       No abnormality visualized. ---------------------------------------------------------------------- Impression  Intrauterine pregnancy at 29+1weeks with CHTN  Normal amniotic fluid  BPP 8/8 ---------------------------------------------------------------------- Recommendations  Continue clinical evaluation and management. ----------------------------------------------------------------------                 Charlsie Merles, MD Electronically Signed Final Report   09/23/2017 08:16 am ----------------------------------------------------------------------  [place] Mfm Ob Follow Up  Result Date: 09/16/2017 ----------------------------------------------------------------------  OBSTETRICS REPORT                      (Signed Final 09/16/2017 01:47 pm) ----------------------------------------------------------------------  Patient Info  ID #:       [id number]                          D.O.B.:  1990/05/30 (27 yrs)  Name:       CORNELL GABER                      Visit Date: 09/16/2017 01:02 pm              [place] ---------------------------------------------------------------------- Performed By  Performed By:     Tommi Emery         Ref. Address:     [facility]                                                             OB/Gyn Clinic                                                             [address]                                                             [place], [place]                                                              [address]  Attending:        Charlsie Merles MD         Location:         [facility]  Referred By:      [facility] for                    [facility]                    Healthcare ---------------------------------------------------------------------- Orders   #  Description                                 Code   1  [place] MFM OB FOLLOW  UP                         [address].60  ----------------------------------------------------------------------   #  Ordered By               Order #        Accession #    Episode #   1  Caryl Ada             [id number]      [id number]     [id number]  ---------------------------------------------------------------------- Indications   [redacted] weeks gestation of pregnancy                Z3A.28   Hypertension - Chronic/Pre-existing            O10.019   (labetalol)  ---------------------------------------------------------------------- Fetal Evaluation  Num Of Fetuses:     1  Fetal Heart         147  Rate(bpm):  Cardiac Activity:   Observed  Presentation:       Cephalic  Placenta:           Posterior, above cervical os  P. Cord Insertion:  Previously Visualized  Amniotic Fluid  AFI FV:      Subjectively within normal limits                              Largest Pocket(cm)                              5.5 ---------------------------------------------------------------------- Biometry  BPD:      69.7  mm     G. Age:  28w 0d         33  %    CI:        79.26   %    70 - 86                                                          FL/HC:      19.7   %    18.8 - 20.6  HC:      247.5  mm     G. Age:  26w 6d        < 3  %    HC/AC:      1.02        1.05 - 1.21  AC:      241.5  mm     G. Age:  28w 3d         51  %    FL/BPD:     70.0   %    71 - 87  FL:       48.8  mm     G. Age:  26w 3d          4  %    FL/AC:      20.2   %    20 - 24  HUM:      46.2  mm     G. Age:  27w 2d         27  %  Est. FW:    1094  gm  2 lb 7 oz     39  %  ---------------------------------------------------------------------- Gestational Age  U/S Today:     27w 3d                                        EDD:   12/13/17  Best:          28w 1d     Det. By:  U/S C R L  (05/05/17)    EDD:   12/08/17 ---------------------------------------------------------------------- Anatomy  Cranium:               Appears normal         Aortic Arch:            Previously seen  Cavum:                 Appears normal         Ductal Arch:            Previously seen  Ventricles:            Appears normal         Diaphragm:              Previously seen  Choroid Plexus:        Previously seen        Stomach:                Appears normal, left                                                                        sided  Cerebellum:            Appears normal         Abdomen:                Previously seen  Posterior Fossa:       Appears normal         Abdominal Wall:         Previously seen  Nuchal Fold:           Previously seen        Cord Vessels:           Previously seen  Face:                  Orbits and profile     Kidneys:                Appear normal                         previously seen  Lips:                  Previously seen        Bladder:                Appears normal  Thoracic:              Previously seen        Spine:  Previously seen  Heart:                 Appears normal         Upper Extremities:      Previously seen                         (4CH, axis, and                         situs)  RVOT:                  Previously seen        Lower Extremities:      Previously seen  LVOT:                  Appears normal  Other:  Female gender previously seen. Technically difficult due to fetal          position. ---------------------------------------------------------------------- Cervix Uterus Adnexa  Cervix  Length:           3.28  cm.  Normal appearance by transabdominal scan.  Uterus  No abnormality visualized.  Left Ovary  Size(cm)       3.2  x   2.4    x  2          Vol(ml): 8  Within normal limits.  Right Ovary  Size(cm)       3.7  x   2.1    x  3         Vol(ml): 12.2  Within normal limits.  Cul De Sac:   No free fluid seen.  Adnexa:       No abnormality visualized. ---------------------------------------------------------------------- Impression  Singleton intrauterine pregnancy at 28+1 weeks with [facility]  here for growth evaluation  Interval review of the anatomy shows no sonographic  markers for aneuploidy or structural anomalies  All relevant fetal anatomy has been visualized  Amniotic fluid volume is normal  Estimated fetal weight shows growth in the 39th percentile ---------------------------------------------------------------------- Recommendations  Recheck [place] for growth in 4 weeks  BP today in the [facility] was 198/107 and 186/108; patient was  taken to MAU for evaluation after her scan ----------------------------------------------------------------------                 Charlsie Merles, MD Electronically Signed Final Report   09/16/2017 01:47 pm ----------------------------------------------------------------------   Future Appointments  Date Time Provider Department Center  09/28/2017  8:55 AM Allie Bossier, MD WOC-WOCA WOC  09/28/2017  9:15 AM WOC-WOCA NST WOC-WOCA WOC  10/14/2017  2:30 PM WH-MFC [place] 1 WH-MFCUS MFC-US    Discharge Condition: Stable  Discharge disposition: 01-Home or Self Care    Discharge Instructions    Discharge activity:   Complete by:  As directed    Limited activity as discussed   Discharge activity:  Up to eat   Complete by:  As directed    Discharge diet:  No restrictions   Complete by:  As directed    Fetal Kick Count:  Lie on our left side for one hour after a meal, and count the number of times your baby kicks.  If it is less than 5 times, get up, move around and drink some juice.  Repeat the test 30 minutes later.  If it is still less than 5 kicks in an hour, notify your doctor.   Complete by:  As directed    No  sexual  activity restrictions   Complete by:  As directed    Notify physician for a general feeling that "something is not right"   Complete by:  As directed    Notify physician for leaking of fluid   Complete by:  As directed    Notify physician for vaginal bleeding   Complete by:  As directed    PRETERM LABOR:  Includes any of the follwing symptoms that occur between 20 - [redacted] weeks gestation.  If these symptoms are not stopped, preterm labor can result in preterm delivery, placing your baby at risk   Complete by:  As directed      Allergies as of 09/25/2017      Reactions   Penicillins Rash   Has patient had a PCN reaction causing immediate rash, facial/tongue/throat swelling, SOB or lightheadedness with hypotension: Yes Has patient had a PCN reaction causing severe rash involving mucus membranes or skin necrosis: Yes Has patient had a PCN reaction that required hospitalization: No Has patient had a PCN reaction occurring within the last 10 years: No If all of the above answers are "NO", then may proceed with Cephalosporin use.      Medication List    TAKE these medications   aspirin EC 81 MG tablet Take 1 tablet (81 mg total) by mouth daily.   NIFEdipine 60 MG 24 hr tablet Commonly known as:  PROCARDIA-XL/ADALAT CC Take 1 tablet (60 mg total) by mouth 2 (two) times daily. What changed:    medication strength  when to take this   PRENATAL VITAMINS PO Take 1 tablet by mouth daily.      Follow-up Information    Allie Bossier, MD Follow up on 09/28/2017.   Specialty:  Obstetrics and Gynecology Why:  8:55 am for prenatal appoinment and ultrasound (biophysical profile) Contact information: [address] [place] [address] [phone]           Signed: Jaynie Collins M.D. 09/25/2017, 4:32 PM

## 2017-09-28 ENCOUNTER — Other Ambulatory Visit: Payer: Self-pay

## 2017-09-28 ENCOUNTER — Ambulatory Visit (INDEPENDENT_AMBULATORY_CARE_PROVIDER_SITE_OTHER): Payer: Medicaid Other | Admitting: Obstetrics & Gynecology

## 2017-09-28 ENCOUNTER — Encounter (HOSPITAL_COMMUNITY): Payer: Self-pay | Admitting: *Deleted

## 2017-09-28 ENCOUNTER — Inpatient Hospital Stay (HOSPITAL_COMMUNITY)
Admission: AD | Admit: 2017-09-28 | Discharge: 2017-09-28 | Disposition: A | Discharge status: Home / Self Care | Payer: Medicaid Other | Source: Ambulatory Visit | Attending: Obstetrics and Gynecology | Admitting: Obstetrics and Gynecology

## 2017-09-28 ENCOUNTER — Other Ambulatory Visit: Payer: Medicaid Other

## 2017-09-28 VITALS — BP 212/123 | HR 152

## 2017-09-28 DIAGNOSIS — O119 Pre-existing hypertension with pre-eclampsia, unspecified trimester: Secondary | ICD-10-CM

## 2017-09-28 DIAGNOSIS — O10013 Pre-existing essential hypertension complicating pregnancy, third trimester: Secondary | ICD-10-CM | POA: Diagnosis not present

## 2017-09-28 DIAGNOSIS — Z880 Allergy status to penicillin: Secondary | ICD-10-CM | POA: Diagnosis not present

## 2017-09-28 DIAGNOSIS — Z7982 Long term (current) use of aspirin: Secondary | ICD-10-CM | POA: Diagnosis not present

## 2017-09-28 DIAGNOSIS — I10 Essential (primary) hypertension: Secondary | ICD-10-CM | POA: Diagnosis present

## 2017-09-28 DIAGNOSIS — O24419 Gestational diabetes mellitus in pregnancy, unspecified control: Secondary | ICD-10-CM

## 2017-09-28 DIAGNOSIS — Z3A29 29 weeks gestation of pregnancy: Secondary | ICD-10-CM | POA: Diagnosis not present

## 2017-09-28 DIAGNOSIS — O113 Pre-existing hypertension with pre-eclampsia, third trimester: Secondary | ICD-10-CM | POA: Diagnosis not present

## 2017-09-28 DIAGNOSIS — O0990 Supervision of high risk pregnancy, unspecified, unspecified trimester: Secondary | ICD-10-CM

## 2017-09-28 DIAGNOSIS — O10919 Unspecified pre-existing hypertension complicating pregnancy, unspecified trimester: Secondary | ICD-10-CM

## 2017-09-28 DIAGNOSIS — O10019 Pre-existing essential hypertension complicating pregnancy, unspecified trimester: Secondary | ICD-10-CM

## 2017-09-28 LAB — PROTEIN / CREATININE RATIO, URINE
CREATININE, URINE: 140 mg/dL
PROTEIN CREATININE RATIO: 0.79 mg/mg{creat} — AB (ref 0.00–0.15)
TOTAL PROTEIN, URINE: 111 mg/dL

## 2017-09-28 LAB — COMPREHENSIVE METABOLIC PANEL
ALT: 29 U/L (ref 14–54)
AST: 35 U/L (ref 15–41)
Albumin: 3.5 g/dL (ref 3.5–5.0)
Alkaline Phosphatase: 78 U/L (ref 38–126)
Anion gap: 13 (ref 5–15)
BILIRUBIN TOTAL: 1 mg/dL (ref 0.3–1.2)
BUN: 10 mg/dL (ref 6–20)
CALCIUM: 8.9 mg/dL (ref 8.9–10.3)
CO2: 18 mmol/L — ABNORMAL LOW (ref 22–32)
Chloride: 102 mmol/L (ref 101–111)
Creatinine, Ser: 0.52 mg/dL (ref 0.44–1.00)
GFR calc Af Amer: >60 mL/min (ref >60)
Glucose, Bld: 94 mg/dL (ref 65–99)
Potassium: 4.4 mmol/L (ref 3.5–5.1)
Sodium: 133 mmol/L — ABNORMAL LOW (ref 135–145)
TOTAL PROTEIN: 7.8 g/dL (ref 6.5–8.1)

## 2017-09-28 LAB — CBC
HEMATOCRIT: 37.4 % (ref 36.0–46.0)
Hemoglobin: 13 g/dL (ref 12.0–15.0)
MCH: 31.3 pg (ref 26.0–34.0)
MCHC: 34.8 g/dL (ref 30.0–36.0)
MCV: 90.1 fL (ref 78.0–100.0)
PLATELETS: 257 10*3/uL (ref 150–400)
RBC: 4.15 MIL/uL (ref 3.87–5.11)
RDW: 12.1 % (ref 11.5–15.5)
WBC: 9.3 10*3/uL (ref 4.0–10.5)

## 2017-09-28 MED ORDER — LABETALOL HCL 5 MG/ML IV SOLN
20.0000 mg | INTRAVENOUS | Status: DC | PRN
  Administered 2017-09-28: 20 mg via INTRAVENOUS
  Administered 2017-09-28: 40 mg via INTRAVENOUS
  Filled 2017-09-28: qty 4
  Filled 2017-09-28: qty 8

## 2017-09-28 MED ORDER — LACTATED RINGERS IV SOLN
INTRAVENOUS | Status: DC
  Administered 2017-09-28: 10:00:00 via INTRAVENOUS

## 2017-09-28 MED ORDER — LABETALOL HCL 200 MG PO TABS: 200.0000 mg | ORAL_TABLET | Freq: Three times a day (TID) | ORAL | 1 refills | Status: DC

## 2017-09-28 MED ORDER — HYDRALAZINE HCL 20 MG/ML IJ SOLN: 10.0000 mg | Freq: Once | INTRAMUSCULAR | Status: DC | PRN

## 2017-09-28 MED ORDER — ONDANSETRON 4 MG PO TBDP: 4.0000 mg | ORAL_TABLET | Freq: Three times a day (TID) | ORAL | 0 refills | Status: DC | PRN

## 2017-09-28 NOTE — MAU Note (Signed)
Patient was here for follow up appointment, she was sent to MAU for increased BP's.

## 2017-09-28 NOTE — Discharge Instructions (Signed)
Hypertension During Pregnancy °Hypertension, commonly called high blood pressure, is when the force of blood pumping through your arteries is too strong. Arteries are blood vessels that carry blood from the heart throughout the body. Hypertension during pregnancy can cause problems for you and your baby. Your baby may be born early (prematurely) or may not weigh as much as he or she should at birth. Very bad cases of hypertension during pregnancy can be life-threatening. °Different types of hypertension can occur during pregnancy. These include: °· Chronic hypertension. This happens when: °? You have hypertension before pregnancy and it continues during pregnancy. °? You develop hypertension before you are [redacted] weeks pregnant, and it continues during pregnancy. °· Gestational hypertension. This is hypertension that develops after the 20th week of pregnancy. °· Preeclampsia, also called toxemia of pregnancy. This is a very serious type of hypertension that develops only during pregnancy. It affects the whole body, and it can be very dangerous for you and your baby. ° °Gestational hypertension and preeclampsia usually go away within 6 weeks after your baby is born. Women who have hypertension during pregnancy have a greater chance of developing hypertension later in life or during future pregnancies. °What are the causes? °The exact cause of hypertension is not known. °What increases the risk? °There are certain factors that make it more likely for you to develop hypertension during pregnancy. These include: °· Having hypertension during a previous pregnancy or prior to pregnancy. °· Being overweight. °· Being older than age 40. °· Being pregnant for the first time or being pregnant with more than one baby. °· Becoming pregnant using fertilization methods such as IVF (in vitro fertilization). °· Having diabetes, kidney problems, or systemic lupus erythematosus. °· Having a family history of hypertension. ° °What are the  signs or symptoms? °Chronic hypertension and gestational hypertension rarely cause symptoms. Preeclampsia causes symptoms, which may include: °· Increased protein in your urine. Your health care provider will check for this at every visit before you give birth (prenatal visit). °· Severe headaches. °· Sudden weight gain. °· Swelling of the hands, face, legs, and feet. °· Nausea and vomiting. °· Vision problems, such as blurred or double vision. °· Numbness in the face, arms, legs, and feet. °· Dizziness. °· Slurred speech. °· Sensitivity to bright lights. °· Abdominal pain. °· Convulsions. ° °How is this diagnosed? °You may be diagnosed with hypertension during a routine prenatal exam. At each prenatal visit, you may: °· Have a urine test to check for high amounts of protein in your urine. °· Have your blood pressure checked. A blood pressure reading is recorded as two numbers, such as "120 over 80" (or 120/80). The first ("top") number is called the systolic pressure. It is a measure of the pressure in your arteries when your heart beats. The second ("bottom") number is called the diastolic pressure. It is a measure of the pressure in your arteries as your heart relaxes between beats. Blood pressure is measured in a unit called mm Hg. A normal blood pressure reading is: °? Systolic: below 120. °? Diastolic: below 80. ° °The type of hypertension that you are diagnosed with depends on your test results and when your symptoms developed. °· Chronic hypertension is usually diagnosed before 20 weeks of pregnancy. °· Gestational hypertension is usually diagnosed after 20 weeks of pregnancy. °· Hypertension with high amounts of protein in the urine is diagnosed as preeclampsia. °· Blood pressure measurements that stay above 160 systolic, or above 110 diastolic, are   signs of severe preeclampsia. ° °How is this treated? °Treatment for hypertension during pregnancy varies depending on the type of hypertension you have and how  serious it is. °· If you take medicines called ACE inhibitors to treat chronic hypertension, you may need to switch medicines. ACE inhibitors should not be taken during pregnancy. °· If you have gestational hypertension, you may need to take blood pressure medicine. °· If you are at risk for preeclampsia, your health care provider may recommend that you take a low-dose aspirin every day to prevent high blood pressure during your pregnancy. °· If you have severe preeclampsia, you may need to be hospitalized so you and your baby can be monitored closely. You may also need to take medicine (magnesium sulfate) to prevent seizures and to lower blood pressure. This medicine may be given as an injection or through an IV tube. °· In some cases, if your condition gets worse, you may need to deliver your baby early. ° °Follow these instructions at home: °Eating and drinking °· Drink enough fluid to keep your urine clear or pale yellow. °· Eat a healthy diet that is low in salt (sodium). Do not add salt to your food. Check food labels to see how much sodium a food or beverage contains. °Lifestyle °· Do not use any products that contain nicotine or tobacco, such as cigarettes and e-cigarettes. If you need help quitting, ask your health care provider. °· Do not use alcohol. °· Avoid caffeine. °· Avoid stress as much as possible. Rest and get plenty of sleep. °General instructions °· Take over-the-counter and prescription medicines only as told by your health care provider. °· While lying down, lie on your left side. This keeps pressure off your baby. °· While sitting or lying down, raise (elevate) your feet. Try putting some pillows under your lower legs. °· Exercise regularly. Ask your health care provider what kinds of exercise are best for you. °· Keep all prenatal and follow-up visits as told by your health care provider. This is important. °Contact a health care provider if: °· You have symptoms that your health care  provider told you may require more treatment or monitoring, such as: °? Fever. °? Vomiting. °? Headache. °Get help right away if: °· You have severe abdominal pain or vomiting that does not get better with treatment. °· You suddenly develop swelling in your hands, ankles, or face. °· You gain 4 lbs (1.8 kg) or more in 1 week. °· You develop vaginal bleeding, or you have blood in your urine. °· You do not feel your baby moving as much as usual. °· You have blurred or double vision. °· You have muscle twitching or sudden tightening (spasms). °· You have shortness of breath. °· Your lips or fingernails turn blue. °This information is not intended to replace advice given to you by your health care provider. Make sure you discuss any questions you have with your health care provider. °Document Released: 01/21/2011 Document Revised: 11/23/2015 Document Reviewed: 10/19/2015 °Elsevier Interactive Patient Education © 2018 [employer] ° °

## 2017-09-28 NOTE — MAU Note (Signed)
Pt up from clinic for elevated b/p. Denies headache, blurred.

## 2017-09-28 NOTE — MAU Provider Note (Addendum)
History     CSN: [id number]  Arrival date and time: 09/28/17 6644   First Provider Initiated Contact with Patient 09/28/17 [phone]      Chief Complaint  Patient presents with  . Hypertension   HPI  ARI Amanda Meza is a 27 y.o. G1P0 at [redacted]w[redacted]d who presents from the office for BP evaluation. Patient with history of CHTN that was recently diagnosed with si PEC & was admitted 5/6-5/10. Meds were switched from labetalol (d/t SE of n/v) to procardia 60 mg XL BID. Last took procardia this morning. Was in office for NST today and had BP of 212/123. Denies headache, visual disturbance, or epigastric pain. Denies contractions, LOF, or vaginal bleeding. Positive fetal movement.   OB History    Gravida  1   Para      Term      Preterm      AB      Living        SAB      TAB      Ectopic      Multiple      Live Births              Past Medical History:  Diagnosis Date  . Anxiety   . Hypertension     Past Surgical History:  Procedure Laterality Date  . NO PAST SURGERIES      No family history on file.  Social History   Tobacco Use  . Smoking status: Never Smoker  . Smokeless tobacco: Never Used  Substance Use Topics  . Alcohol use: No    Frequency: Never  . Drug use: No    Allergies:  Allergies  Allergen Reactions  . Penicillins Rash    Has patient had a PCN reaction causing immediate rash, facial/tongue/throat swelling, SOB or lightheadedness with hypotension: Yes Has patient had a PCN reaction causing severe rash involving mucus membranes or skin necrosis: Yes Has patient had a PCN reaction that required hospitalization: No Has patient had a PCN reaction occurring within the last 10 years: No If all of the above answers are "NO", then may proceed with Cephalosporin use.     Medications Prior to Admission  Medication Sig Dispense Refill Last Dose  . aspirin EC 81 MG tablet Take 1 tablet (81 mg total) by mouth daily. 30 tablet 5 Taking  .  NIFEdipine (PROCARDIA-XL/ADALAT CC) 60 MG 24 hr tablet Take 1 tablet (60 mg total) by mouth 2 (two) times daily. 60 tablet 2 Taking  . Prenatal Multivit-Min-Fe-FA (PRENATAL VITAMINS PO) Take 1 tablet by mouth daily.    Taking    Review of Systems  Constitutional: Negative.   Eyes: Negative for visual disturbance.  Gastrointestinal: Negative.   Genitourinary: Negative.   Neurological: Negative for headaches.   Physical Exam   Blood pressure (!) 142/95, pulse 91, temperature 98.9 F (37.2 C), temperature source Oral, resp. rate 16, last menstrual period 03/03/2017, SpO2 100 %. Patient Vitals for the past 24 hrs:  BP Temp Temp src Pulse Resp SpO2  09/28/17 1135 (!) 142/95 98.9 F (37.2 C) Oral 91 16 -  09/28/17 1134 - - - - - 100 %  09/28/17 1131 - - - - - 100 %  09/28/17 1126 - - - - - 99 %  09/28/17 1121 - - - - - 100 %  09/28/17 1120 (!) 139/97 - - 88 - -  09/28/17 1116 - - - - - 100 %  09/28/17 1111 - - - - -  100 %  09/28/17 1110 (!) 134/96 - - 91 - -  09/28/17 1106 - - - - - 99 %  09/28/17 1100 (!) 136/96 - - 92 - 99 %  09/28/17 1056 - - - - - 99 %  09/28/17 1051 - - - - - 99 %  09/28/17 1050 (!) 137/95 - - 88 - -  09/28/17 1046 - - - - - 99 %  09/28/17 1041 (!) 148/97 - - 92 - -  09/28/17 1031 - - - - - 100 %  09/28/17 1030 (!) 162/110 - - 91 - -  09/28/17 1026 - - - - - 100 %  09/28/17 1021 - - - - - 100 %  09/28/17 1020 (!) 158/105 - - 90 - -  09/28/17 1016 - - - - - 100 %  09/28/17 1011 - - - - - 100 %  09/28/17 1010 (!) 146/104 99.6 F (37.6 C) Oral 94 16 100 %  09/28/17 1001 (!) 165/108 - - (!) 102 - -  09/28/17 0959 (!) 157/105 - - 97 - -  09/28/17 0950 (!) 173/112 - - (!) 110 - -    Physical Exam  Nursing note and vitals reviewed. Constitutional: She is oriented to person, place, and time. She appears well-developed and well-nourished. No distress.  HENT:  Head: Normocephalic and atraumatic.  Eyes: Conjunctivae are normal. Right eye exhibits no  discharge. Left eye exhibits no discharge. No scleral icterus.  Neck: Normal range of motion.  Cardiovascular: Normal rate, regular rhythm and normal heart sounds.  No murmur heard. Respiratory: Effort normal and breath sounds normal. No respiratory distress. She has no wheezes.  GI: Soft. Bowel sounds are normal. There is no tenderness.  Genitourinary:  Genitourinary Comments: Dilation: Closed Effacement (%): Thick Cervical Position: Posterior Exam by:: Judeth Horn NP   Musculoskeletal: She exhibits no edema.  Neurological: She is alert and oriented to person, place, and time. She has normal reflexes.  No clonus  Skin: Skin is warm and dry. She is not diaphoretic.  Psychiatric: She has a normal mood and affect. Her behavior is normal. Judgment and thought content normal.    MAU Course  Procedures Results for orders placed or performed during the hospital encounter of 09/28/17 (from the past 24 hour(s))  Protein / creatinine ratio, urine     Status: Abnormal   Collection Time: 09/28/17  9:26 AM  Result Value Ref Range   Creatinine, Urine 140.00 mg/dL   Total Protein, Urine 111 mg/dL   Protein Creatinine Ratio 0.79 (H) 0.00 - 0.15 mg/mg[Cre]  Comprehensive metabolic panel     Status: Abnormal   Collection Time: 09/28/17  9:47 AM  Result Value Ref Range   Sodium 133 (L) 135 - 145 mmol/L   Potassium 4.4 3.5 - 5.1 mmol/L   Chloride 102 101 - 111 mmol/L   CO2 18 (L) 22 - 32 mmol/L   Glucose, Bld 94 65 - 99 mg/dL   BUN 10 6 - 20 mg/dL   Creatinine, Ser 0.86 0.44 - 1.00 mg/dL   Calcium 8.9 8.9 - 57.8 mg/dL   Total Protein 7.8 6.5 - 8.1 g/dL   Albumin 3.5 3.5 - 5.0 g/dL   AST 35 15 - 41 U/L   ALT 29 14 - 54 U/L   Alkaline Phosphatase 78 38 - 126 U/L   Total Bilirubin 1.0 0.3 - 1.2 mg/dL   GFR calc non Af Amer >60 >60 mL/min   GFR calc Af  Amer >60 >60 mL/min   Anion gap 13 5 - 15  CBC     Status: None   Collection Time: 09/28/17  9:47 AM  Result Value Ref Range   WBC 9.3  4.0 - 10.5 K/uL   RBC 4.15 3.87 - 5.11 MIL/uL   Hemoglobin 13.0 12.0 - 15.0 g/dL   HCT 16.1 09.6 - 04.5 %   MCV 90.1 78.0 - 100.0 fL   MCH 31.3 26.0 - 34.0 pg   MCHC 34.8 30.0 - 36.0 g/dL   RDW 40.9 81.1 - 91.4 %   Platelets 257 150 - 400 K/uL    MDM NST:  Baseline: 135 bpm, Variability: Good {> 6 bpm), Accelerations: Non-reactive but appropriate for gestational age and Decelerations: Absent Ctx on monitor; pt denies. Cervix closed/thick.   IV antihypertensive protocol ordered for severe range BPs. Patient given 2 doses of labetalol ( , ) with improvement in BP. Pt remains asymptomatic  C/w Dr. Alysia Penna. Reviewed patient's history, BPs, and labs. Patient asymptomatic at this time; labs stable but increase in proteinuria. Pt received BMZ during previous admission. Will d/c pt home and add labetalol back to her regimen. Will have her f/u in office on Thursday for NST and BP check.   Assessment and Plan  A: 1. Pre-eclampsia superimposed on chronic hypertension, antepartum   2. [redacted] weeks gestation of pregnancy   3. Chronic hypertension during pregnancy, antepartum    P: Discharge home Rx labetalol 200 mg TID to start this afternoon per consult with pharmacy Rx zofran to take PRN if labetalol causes n/v Pt scheduled for NST & BP check on Thursday morning Discussed reasons to return to MAU including s/s worsening PEC  Judeth Horn 09/28/2017, 10:00 AM   Agree with above. No S/Sx of significant worsening PEC per labs. Fetal well being is reassuring. Suspect suboptimal BP control.  Will add Labetalol to Procardia. F/U on Thursday.  Nettie Elm, MD [facility] Attending

## 2017-09-28 NOTE — Progress Notes (Signed)
She is here for NST but with her BP being 212/123, she will go directly to MAU, possible re admission.

## 2017-10-01 ENCOUNTER — Ambulatory Visit (INDEPENDENT_AMBULATORY_CARE_PROVIDER_SITE_OTHER): Payer: Medicaid Other | Admitting: *Deleted

## 2017-10-01 ENCOUNTER — Inpatient Hospital Stay (HOSPITAL_COMMUNITY)
Admission: AD | Admit: 2017-10-01 | Discharge: 2017-10-05 | DRG: 833 | Disposition: A | Discharge status: Home / Self Care | Payer: Medicaid Other | Attending: Obstetrics and Gynecology | Admitting: Obstetrics and Gynecology

## 2017-10-01 ENCOUNTER — Encounter (HOSPITAL_COMMUNITY): Payer: Self-pay

## 2017-10-01 ENCOUNTER — Other Ambulatory Visit: Payer: Self-pay

## 2017-10-01 VITALS — BP 175/112 | HR 92

## 2017-10-01 DIAGNOSIS — O113 Pre-existing hypertension with pre-eclampsia, third trimester: Principal | ICD-10-CM | POA: Diagnosis present

## 2017-10-01 DIAGNOSIS — O24419 Gestational diabetes mellitus in pregnancy, unspecified control: Secondary | ICD-10-CM | POA: Diagnosis present

## 2017-10-01 DIAGNOSIS — O119 Pre-existing hypertension with pre-eclampsia, unspecified trimester: Secondary | ICD-10-CM

## 2017-10-01 DIAGNOSIS — Z880 Allergy status to penicillin: Secondary | ICD-10-CM | POA: Diagnosis not present

## 2017-10-01 DIAGNOSIS — Z3A30 30 weeks gestation of pregnancy: Secondary | ICD-10-CM

## 2017-10-01 DIAGNOSIS — O10013 Pre-existing essential hypertension complicating pregnancy, third trimester: Secondary | ICD-10-CM | POA: Diagnosis present

## 2017-10-01 DIAGNOSIS — O24410 Gestational diabetes mellitus in pregnancy, diet controlled: Secondary | ICD-10-CM | POA: Diagnosis present

## 2017-10-01 DIAGNOSIS — O10913 Unspecified pre-existing hypertension complicating pregnancy, third trimester: Secondary | ICD-10-CM | POA: Diagnosis not present

## 2017-10-01 DIAGNOSIS — I10 Essential (primary) hypertension: Secondary | ICD-10-CM

## 2017-10-01 LAB — GLUCOSE, CAPILLARY
GLUCOSE-CAPILLARY: 88 mg/dL (ref 65–99)
GLUCOSE-CAPILLARY: 85 mg/dL (ref 65–99)

## 2017-10-01 LAB — CBC
HCT: 31.6 % — ABNORMAL LOW (ref 36.0–46.0)
Hemoglobin: 11.1 g/dL — ABNORMAL LOW (ref 12.0–15.0)
MCH: 31 pg (ref 26.0–34.0)
MCHC: 35.1 g/dL (ref 30.0–36.0)
MCV: 88.3 fL (ref 78.0–100.0)
PLATELETS: 218 10*3/uL (ref 150–400)
RBC: 3.58 MIL/uL — ABNORMAL LOW (ref 3.87–5.11)
RDW: 12.3 % (ref 11.5–15.5)
WBC: 7.4 10*3/uL (ref 4.0–10.5)

## 2017-10-01 LAB — COMPREHENSIVE METABOLIC PANEL
ALT: 30 U/L (ref 14–54)
ANION GAP: 10 (ref 5–15)
AST: 26 U/L (ref 15–41)
Albumin: 2.9 g/dL — ABNORMAL LOW (ref 3.5–5.0)
Alkaline Phosphatase: 68 U/L (ref 38–126)
BILIRUBIN TOTAL: 0.3 mg/dL (ref 0.3–1.2)
BUN: 8 mg/dL (ref 6–20)
CHLORIDE: 105 mmol/L (ref 101–111)
CO2: 20 mmol/L — ABNORMAL LOW (ref 22–32)
Calcium: 8.6 mg/dL — ABNORMAL LOW (ref 8.9–10.3)
Creatinine, Ser: 0.55 mg/dL (ref 0.44–1.00)
Glucose, Bld: 76 mg/dL (ref 65–99)
POTASSIUM: 4.2 mmol/L (ref 3.5–5.1)
Sodium: 135 mmol/L (ref 135–145)
TOTAL PROTEIN: 6.9 g/dL (ref 6.5–8.1)

## 2017-10-01 LAB — LACTATE DEHYDROGENASE: LDH: 114 U/L (ref 98–192)

## 2017-10-01 LAB — URIC ACID: URIC ACID, SERUM: 4.3 mg/dL (ref 2.3–6.6)

## 2017-10-01 MED ORDER — ACETAMINOPHEN 325 MG PO TABS: 650.0000 mg | ORAL_TABLET | ORAL | Status: DC | PRN

## 2017-10-01 MED ORDER — ZOLPIDEM TARTRATE 5 MG PO TABS: 5.0000 mg | ORAL_TABLET | Freq: Every evening | ORAL | Status: DC | PRN

## 2017-10-01 MED ORDER — ASPIRIN EC 81 MG PO TBEC
81.0000 mg | DELAYED_RELEASE_TABLET | Freq: Every day | ORAL | Status: DC
  Administered 2017-10-02 – 2017-10-04 (×3): 81 mg via ORAL
  Filled 2017-10-01 (×5): qty 1

## 2017-10-01 MED ORDER — ENOXAPARIN SODIUM 40 MG/0.4ML ~~LOC~~ SOLN
40.0000 mg | SUBCUTANEOUS | Status: DC
  Filled 2017-10-01 (×2): qty 0.4

## 2017-10-01 MED ORDER — MAGNESIUM SULFATE 40 G IN LACTATED RINGERS - SIMPLE
2.0000 g/h | INTRAVENOUS | Status: DC
  Administered 2017-10-01 – 2017-10-02 (×2): 2 g/h via INTRAVENOUS
  Filled 2017-10-01 (×2): qty 40

## 2017-10-01 MED ORDER — CALCIUM CARBONATE ANTACID 500 MG PO CHEW: 2.0000 | CHEWABLE_TABLET | ORAL | Status: DC | PRN

## 2017-10-01 MED ORDER — LABETALOL HCL 5 MG/ML IV SOLN
INTRAVENOUS | Status: AC
  Administered 2017-10-01: 20 mg
  Filled 2017-10-01: qty 4

## 2017-10-01 MED ORDER — LACTATED RINGERS IV SOLN
INTRAVENOUS | Status: DC
  Administered 2017-10-01 – 2017-10-02 (×2): via INTRAVENOUS

## 2017-10-01 MED ORDER — NIFEDIPINE ER OSMOTIC RELEASE 30 MG PO TB24
60.0000 mg | ORAL_TABLET | Freq: Two times a day (BID) | ORAL | Status: DC
  Administered 2017-10-01 – 2017-10-05 (×8): 60 mg via ORAL
  Filled 2017-10-01 (×8): qty 2

## 2017-10-01 MED ORDER — PRENATAL MULTIVITAMIN CH
1.0000 | ORAL_TABLET | Freq: Every day | ORAL | Status: DC
  Administered 2017-10-03 – 2017-10-04 (×2): 1 via ORAL
  Filled 2017-10-01 (×2): qty 1

## 2017-10-01 MED ORDER — DOCUSATE SODIUM 100 MG PO CAPS
100.0000 mg | ORAL_CAPSULE | Freq: Every day | ORAL | Status: DC
  Administered 2017-10-02: 100 mg via ORAL
  Filled 2017-10-01 (×3): qty 1

## 2017-10-01 NOTE — H&P (Signed)
HPI  27 yo G1P0 IUP 30 2/7 weeks admitted with DX of CHTN SIPEC.  Pt was recently admitted from 5/6-5/10 for the same. BP medications adjusted and BP stabilized. Received BMZ.  However since discharge BP's have remain elevated even with the addition of Labetalol. Seen in office today with elevated BP Admitted for additional management Pt reports + FM occ ut ctx. Denies HA, visual changes, vaginal bleeding or LOF.  U/S 09/16/17 1094 gms, 39 %  OB History    Gravida  1   Para      Term      Preterm      AB      Living        SAB      TAB      Ectopic      Multiple      Live Births             Past Medical History:  Diagnosis Date  . Anxiety   . Hypertension    Past Surgical History:  Procedure Laterality Date  . NO PAST SURGERIES     Family History: family history is not on file. Social History:  reports that she has never smoked. She has never used smokeless tobacco. She reports that she does not drink alcohol or use drugs.     Maternal Diabetes: Yes:  Diabetes Type:  Diet controlled Genetic Screening: Normal Maternal Ultrasounds/Referrals: Normal Fetal Ultrasounds or other Referrals:  None Maternal Substance Abuse:  No Significant Maternal Medications:  Meds include: Other:  Significant Maternal Lab Results:  None Other Comments:  None  Review of Systems  Constitutional: Negative.   Eyes: Negative.   Respiratory: Negative.   Cardiovascular: Positive for leg swelling.  Gastrointestinal: Negative.       Blood pressure 136/88, pulse 67, temperature 98.9 F (37.2 C), resp. rate 20, height  (1.651 m), weight 78 kg (172 lb), last menstrual period 03/03/2017, SpO2 100 %.   Exam Physical Exam  Constitutional: She appears well-developed and well-nourished.  Cardiovascular: Normal rate, regular rhythm and normal heart sounds.  Respiratory: Effort normal and breath sounds normal.  GI: Soft. Bowel sounds are normal.  Gravid FH = dates   Genitourinary:  Genitourinary Comments: deferred  Musculoskeletal:  DTR's normal, 1 + edema    Prenatal labs: ABO, Rh: --/--/A POS (05/06 1840) Antibody: NEG (05/06 1840) Rubella: 1.38 (02/13 1136) RPR: Non Reactive (05/02 0840)  HBsAg: Negative (02/13 1136)  HIV: Non Reactive (05/02 0840)  GBS:     Assessment/Plan: IUP 30 2/7 weeks CHTN with SIPEC Class A 1 DM  Pt will be admitted for additional BP control. Magnesium for eclamptic prophylaxis. Follow CBG's. Continuous FM for now. Continue with Procardia XL and antihypertensive proctol calls as needed.  POC reviewed with pt and FOB. Verbalized understanding.   Amanda Meza 10/01/2017, 11:32 AM

## 2017-10-01 NOTE — Progress Notes (Signed)
Pt reports decreased fetal movement x3 days - since starting Labetalol. She denies H/A or visual disturbances. Dr. Shawnie Pons in to see pt and review recommended plan of care.  Pt agrees to hospital admission due to [facility], uncontrolled and severe pre-eclampsia. Pt taken to MAU registration for admit.

## 2017-10-02 ENCOUNTER — Inpatient Hospital Stay (HOSPITAL_COMMUNITY): Payer: Medicaid Other

## 2017-10-02 DIAGNOSIS — O10913 Unspecified pre-existing hypertension complicating pregnancy, third trimester: Secondary | ICD-10-CM

## 2017-10-02 LAB — GLUCOSE, CAPILLARY
GLUCOSE-CAPILLARY: 87 mg/dL (ref 65–99)
Glucose-Capillary: 111 mg/dL — ABNORMAL HIGH (ref 65–99)

## 2017-10-02 LAB — CREATININE CLEARANCE, URINE, 24 HOUR
CREATININE 24H UR: 1404 mg/d (ref 600–1800)
CREATININE, URINE: 76.93 mg/dL
Collection Interval-CRCL: 24 hours
Creatinine Clearance: 177 mL/min — ABNORMAL HIGH (ref 75–115)
URINE TOTAL VOLUME-CRCL: 1825 mL

## 2017-10-02 LAB — PROTEIN, URINE, 24 HOUR
Collection Interval-UPROT: 24 hours
PROTEIN, 24H URINE: 219 mg/d — AB (ref 50–100)
PROTEIN, URINE: 12 mg/dL
URINE TOTAL VOLUME-UPROT: 1825 mL

## 2017-10-02 MED ORDER — SODIUM CHLORIDE 0.9% FLUSH
3.0000 mL | INTRAVENOUS | Status: DC | PRN
  Administered 2017-10-04: 3 mL via INTRAVENOUS
  Filled 2017-10-02: qty 3

## 2017-10-02 MED ORDER — SODIUM CHLORIDE 0.9% FLUSH
3.0000 mL | Freq: Two times a day (BID) | INTRAVENOUS | Status: DC
  Administered 2017-10-02 – 2017-10-03 (×2): 3 mL via INTRAVENOUS

## 2017-10-02 MED ORDER — SODIUM CHLORIDE 0.9 % IV SOLN: 250.0000 mL | INTRAVENOUS | Status: DC | PRN

## 2017-10-02 NOTE — Progress Notes (Signed)
To pt's room to adjust FHR monitor and to stop IV pump from beeping. Upon pt's return to bed she said she had "a small smear of blood, maybe I wiped too hard" I asked pt. To see the tissue, she flushed tissue. Asked pt. To keep any tissue in the future if she saw bleeding again. No bleeding noted by RN. Will monitor.

## 2017-10-02 NOTE — Progress Notes (Signed)
Pt sleeping. 

## 2017-10-02 NOTE — Progress Notes (Signed)
Patient ID: DEZHANE STATEN, female   DOB: December 08, 1990, 27 y.o.   MRN: [id number] [facility] ANTEPARTUM COMPREHENSIVE PROGRESS NOTE  TYLAR MERENDINO is a 27 y.o. G1P0 at [redacted]w[redacted]d  who is admitted for [facility] with SIPEC and GDM, diet controlled. .   Fetal presentation is cephalic. Length of Stay:  1  Days  Subjective: Pt is without complaints today. Denies HA or visual changes. + FM. Denies vaginal bleeding, LOF or ut ctx. Tolerating diet. Up to rest room without problems.  Vitals:  Blood pressure 140/85, pulse 92, temperature 98.2 F (36.8 C), temperature source Oral, resp. rate 18, height  (1.651 m), weight 78 kg (172 lb), last menstrual period 03/03/2017, SpO2 100 %. Physical Examination: Lungs clear Heart RRR Abd soft + BS gravid Ext non tender DTR's nl  Fetal Monitoring:  120-130's + accels,   Labs:  Results for orders placed or performed during the hospital encounter of 10/01/17 (from the past 24 hour(s))  Glucose, capillary   Collection Time: 10/01/17  6:17 PM  Result Value Ref Range   Glucose-Capillary 88 65 - 99 mg/dL  Glucose, capillary   Collection Time: 10/01/17 10:02 PM  Result Value Ref Range   Glucose-Capillary 85 65 - 99 mg/dL  Glucose, capillary   Collection Time: 10/02/17  1:49 PM  Result Value Ref Range   Glucose-Capillary 111 (H) 65 - 99 mg/dL    Imaging Studies:    See BPP report   Medications:  Scheduled . aspirin EC  81 mg Oral Daily  . docusate sodium  100 mg Oral Daily  . enoxaparin (LOVENOX) injection  40 mg Subcutaneous Q24H  . NIFEdipine  60 mg Oral BID  . prenatal multivitamin  1 tablet Oral Q1200  . sodium chloride flush  3 mL Intravenous Q12H   I have reviewed the patient's current medications.  ASSESSMENT: IUP 30 3/7 weeks CHTN with SIPEC GDM, diet controlled  PLAN: Stable. Will d/c magnesium. 24 hr urine pending. CBG's in goal range. BP stable on Procardia. Will follow off magnesium and with increase activity. BPP  8/10 to day. NST q shift.  Continue routine antenatal care.   Hermina Staggers 10/02/2017,1:51 PM

## 2017-10-03 LAB — CBC
HCT: 31.1 % — ABNORMAL LOW (ref 36.0–46.0)
Hemoglobin: 10.9 g/dL — ABNORMAL LOW (ref 12.0–15.0)
MCH: 31.7 pg (ref 26.0–34.0)
MCHC: 35 g/dL (ref 30.0–36.0)
MCV: 90.4 fL (ref 78.0–100.0)
PLATELETS: 203 10*3/uL (ref 150–400)
RBC: 3.44 MIL/uL — AB (ref 3.87–5.11)
RDW: 12.5 % (ref 11.5–15.5)
WBC: 7.2 10*3/uL (ref 4.0–10.5)

## 2017-10-03 LAB — COMPREHENSIVE METABOLIC PANEL
ALT: 34 U/L (ref 14–54)
AST: 29 U/L (ref 15–41)
Albumin: 2.7 g/dL — ABNORMAL LOW (ref 3.5–5.0)
Alkaline Phosphatase: 70 U/L (ref 38–126)
Anion gap: 7 (ref 5–15)
BUN: 8 mg/dL (ref 6–20)
CO2: 21 mmol/L — AB (ref 22–32)
Calcium: 8.3 mg/dL — ABNORMAL LOW (ref 8.9–10.3)
Chloride: 107 mmol/L (ref 101–111)
Creatinine, Ser: 0.59 mg/dL (ref 0.44–1.00)
GFR calc non Af Amer: >60 mL/min (ref >60)
Glucose, Bld: 70 mg/dL (ref 65–99)
POTASSIUM: 4.1 mmol/L (ref 3.5–5.1)
SODIUM: 135 mmol/L (ref 135–145)
Total Bilirubin: 0.2 mg/dL — ABNORMAL LOW (ref 0.3–1.2)
Total Protein: 6.6 g/dL (ref 6.5–8.1)

## 2017-10-03 LAB — GLUCOSE, CAPILLARY
GLUCOSE-CAPILLARY: 81 mg/dL (ref 65–99)
GLUCOSE-CAPILLARY: 101 mg/dL — AB (ref 65–99)
Glucose-Capillary: 62 mg/dL — ABNORMAL LOW (ref 65–99)
Glucose-Capillary: 74 mg/dL (ref 65–99)

## 2017-10-03 LAB — OB RESULTS CONSOLE GBS: STREP GROUP B AG: NEGATIVE

## 2017-10-03 LAB — TYPE AND SCREEN
ABO/RH(D): A POS
ANTIBODY SCREEN: NEGATIVE

## 2017-10-03 MED ORDER — HYDRALAZINE HCL 20 MG/ML IJ SOLN: 10.0000 mg | Freq: Once | INTRAMUSCULAR | Status: DC | PRN

## 2017-10-03 MED ORDER — LABETALOL HCL 5 MG/ML IV SOLN: 20.0000 mg | INTRAVENOUS | Status: DC | PRN

## 2017-10-03 MED ORDER — LACTATED RINGERS IV SOLN
INTRAVENOUS | Status: DC
  Administered 2017-10-03 – 2017-10-04 (×2): via INTRAVENOUS

## 2017-10-03 MED ORDER — MAGNESIUM SULFATE 40 G IN LACTATED RINGERS - SIMPLE
2.0000 g/h | INTRAVENOUS | Status: DC
  Administered 2017-10-03: 2 g/h via INTRAVENOUS
  Filled 2017-10-03: qty 40

## 2017-10-03 MED ORDER — MAGNESIUM SULFATE BOLUS VIA INFUSION
4.0000 g | Freq: Once | INTRAVENOUS | Status: AC
  Administered 2017-10-03: 4 g via INTRAVENOUS
  Filled 2017-10-03: qty 500

## 2017-10-03 MED ORDER — LACTATED RINGERS IV BOLUS
1000.0000 mL | Freq: Once | INTRAVENOUS | Status: AC
  Administered 2017-10-03: 1000 mL via INTRAVENOUS

## 2017-10-03 NOTE — Progress Notes (Signed)
NICU charge nurse aware of pt needing to be seen by neonatologist.

## 2017-10-03 NOTE — Progress Notes (Signed)
Upon entering pt's room, pt stated "I'm starting to feel some cramping in my lower stomach." Pt rated pain a 5 out of 10. This is the first time the pt has started to feel the ctx. Dr. Emelda Fear on the unit and was made aware of the pt's situation. See new orders.

## 2017-10-03 NOTE — Progress Notes (Signed)
Ok to take pt off fetal monitor per Dr. Emelda Fear. Told to put her back on around 3:00 pm this afternoon.

## 2017-10-03 NOTE — Consult Note (Signed)
[facility]  [facility] 10/03/2017    10:26 PM  Neonatal Medicine Consultation         Amanda Meza          MRN:  [id number]  I was called at the request of the patient's obstetrician (Dr. Emelda Fear) to speak to this patient due to potential preterm delivery as early as 30-31 weeks.  The patient's prenatal course includes gestational diabetes (diet-controlled), chronic hypertension and superimposed preeclampsia.  Blood pressure control has been difficult.  She got betamethasone on 5/6 and 5/7, and has been readmitted as of 5/16.  She is 30 5/7 weeks currently.  She is admitted to the 3rd floor unit, and is receiving treatment that includes magnesium sulfate, Nifedipine, as well as several prn antihypertensives.  The baby is a female.  I reviewed expectations for a baby born at 30+ weeks, including survival, length of stay, morbidities such as respiratory distress, IVH, infection, feeding intolerance.  I described how we provide respiratory and feeding support.  Mom plans to breast feed, which I encouraged as best for the baby (with supplementations for needed calories).  I let mom know that the baby's outlook generally improves the longer she remains undelivered.  I spent 20 minutes reviewing the record, speaking to the patient, and entering appropriate documentation.  More than 50% of the time was spent face to face with patient.   _____________________ Electronically Signed By: Angelita Ingles, MD Neonatologist

## 2017-10-03 NOTE — Progress Notes (Addendum)
2000 Patient at this time refused Magnesium therapy for reasons as claimed; "it makes me feel awful and my baby's movement is decreased."  Discussed advantage of Magnesium therapy especially for baby and informed pt. Dr. Emelda Fear, attending physician and neonatologist will be requested to come to talk to patient. 2005 Dr. Emelda Fear at bedside.   10/03/17 2000  Vital Signs  BP (!) 167/95  BP Location Left Arm  Patient Position (if appropriate) High-fowlers  BP Method Automatic  Pulse Rate 82  Pulse Rate Source Monitor  Resp 20  Temp 98.7 F (37.1 C)  Temp Source Oral  Oxygen Therapy  SpO2 100 %  O2 Device Room Air  Magnesium 4 gms bolus iniated

## 2017-10-03 NOTE — Progress Notes (Signed)
FACULTY PRACTICE ANTEPARTUM(COMPREHENSIVE) NOTE  Amanda Meza is a 27 y.o. G1P0 at [redacted]w[redacted]d  who is admitted for [facility] with SIPE. She was previously tx as outpt and now is readmitted .   Fetal presentation is cephalic. Length of Stay:  2  Days  Subjective: Pt is having mild contractions. Her fetal monitoring this morning had 2 variables, none since the first few minutes of monitoring. Pt is not FEELING the contractions but they show up on EFM Patient reports the fetal movement as active. Patient reports uterine contraction  activity as none, but on monitor there are a few. Patient reports  vaginal bleeding as none. Patient describes fluid per vagina as None.  Vitals:  Blood pressure 138/82, pulse 74, temperature 98.8 F (37.1 C), temperature source Oral, resp. rate 18, height  (1.651 m), weight 172 lb (78 kg), last menstrual period 03/03/2017, SpO2 100 %. Physical Examination:  General appearance - alert, well appearing, and in no distress, oriented to person, place, and time and normal appearing weight Heart - normal rate and regular rhythm Abdomen - soft, nontender, nondistended Fundal Height:  size equals dates Cervical Exam: Evaluated by digital exam., Position: posterior, Dilation: 0cm, Thickness: 40% effaced and Consistency: firm and found to be closed/ 40%/-2 and fetal presentation is cephalic. Extremities: extremities normal, atraumatic, no cyanosis or edema and Homans sign is negative, no sign of DVT with DTRs 2+ bilaterally Membranes:intact  Fetal Monitoring:  Baseline: 140 bpm, Variability: Good {> 6 bpm), Accelerations: Reactive and Decelerations: Variable: mild  Labs:  Results for orders placed or performed during the hospital encounter of 10/01/17 (from the past 24 hour(s))  Glucose, capillary   Collection Time: 10/02/17  1:49 PM  Result Value Ref Range   Glucose-Capillary 111 (H) 65 - 99 mg/dL  Glucose, capillary   Collection Time: 10/02/17  6:49 PM  Result  Value Ref Range   Glucose-Capillary 87 65 - 99 mg/dL  Glucose, capillary   Collection Time: 10/03/17  5:28 AM  Result Value Ref Range   Glucose-Capillary 62 (L) 65 - 99 mg/dL  Glucose, capillary   Collection Time: 10/03/17  6:35 AM  Result Value Ref Range   Glucose-Capillary 81 65 - 99 mg/dL  Glucose, capillary   Collection Time: 10/03/17 11:35 AM  Result Value Ref Range   Glucose-Capillary 74 65 - 99 mg/dL   Comment 1 Notify RN    Comment 2 Document in Chart     Imaging Studies:     Currently EPIC will not allow sonographic studies to automatically populate into notes.  In the meantime, copy and paste results into note or free text.  Medications:  Scheduled . aspirin EC  81 mg Oral Daily  . docusate sodium  100 mg Oral Daily  . enoxaparin (LOVENOX) injection  40 mg Subcutaneous Q24H  . NIFEdipine  60 mg Oral BID  . prenatal multivitamin  1 tablet Oral Q1200  . sodium chloride flush  3 mL Intravenous Q12H   I have reviewed the patient's current medications.  ASSESSMENT: Patient Active Problem List   Diagnosis Date Noted  . Chronic hypertension with exacerbation during pregnancy in third trimester 10/01/2017  . Gestational diabetes 09/21/2017  . Chronic hypertension with superimposed pre-eclampsia 09/21/2017  . Supervision of high risk pregnancy, antepartum 07/01/2017  . Chronic hypertension during pregnancy, antepartum 07/01/2017    PLAN: 1. Continue inpt care, with TID efm x 20 mins.  2  Will not tx tocolytics, minimal contractions 3 has received BMZ  NOTE: pt comfortable with inpt care if necessary til delivery  Tilda Burrow 10/03/2017,1:07 PM    Patient ID: Amanda Meza, female   DOB: [birth year], 27 y.o.   MRN: [id number]

## 2017-10-03 NOTE — Progress Notes (Signed)
Patient ID: Amanda Meza, female   DOB: 01-10-1991, 27 y.o.   MRN: [id number] Pt continues with mild contractions on EFM , not felt by pt. Already on Procardia xl 60 bid, has completed Mag Sulfate neuroprophylaxis Has completed Betamethasone this hospitalization NO d/c spotting , or decels  Cervix check this am was reassuring. BP (!) 155/89 (BP Location: Left Arm)   Pulse 90   Temp 98.8 F (37.1 C) (Oral)   Resp 18   Ht  (1.651 m)   Wt 172 lb (78 kg)   LMP 03/03/2017 (Exact Date)   SpO2 100%   BMI 28.62 kg/m   In a pt with the dx of CHTN and SIPE, will not restart Mag.

## 2017-10-04 LAB — GLUCOSE, CAPILLARY
GLUCOSE-CAPILLARY: 66 mg/dL (ref 65–99)
GLUCOSE-CAPILLARY: 92 mg/dL (ref 65–99)
Glucose-Capillary: 70 mg/dL (ref 65–99)
Glucose-Capillary: 96 mg/dL (ref 65–99)

## 2017-10-04 NOTE — Progress Notes (Signed)
Patient ID: Amanda Meza, female   DOB: January 27, 1991, 27 y.o.   MRN: [id number] FACULTY PRACTICE ANTEPARTUM(COMPREHENSIVE) NOTE  Amanda Meza is a 27 y.o. G1P0 at [redacted]w[redacted]d  who is admitted for [facility] with SIPE. Second admission. Last night pt took Mag x 24 hr, and her contractions stopped..   She had some mild varibles.  Fetal presentation is cephalic. Length of Stay:  3  Days  Subjective: Resting comfortably Patient reports the fetal movement as active. Patient reports uterine contraction  activity as none. Patient reports  vaginal bleeding as none. Patient describes fluid per vagina as None. Temp:  [97.6 F (36.4 C)-98.8 F (37.1 C)] 97.7 F (36.5 C) (05/19 0600) Pulse Rate:  [74-98] 79 (05/19 0600) Resp:  [15-20] 15 (05/19 0600) BP: (128-167)/(68-100) 134/81 (05/19 0600) SpO2:  [98 %-100 %] 100 % (05/19 0600)  Vitals:  Blood pressure 134/81, pulse 79, temperature 97.7 F (36.5 C), temperature source Oral, resp. rate 15, height  (1.651 m), weight 172 lb (78 kg), last menstrual period 03/03/2017, SpO2 100 %. Physical Examination:  General appearance - alert, well appearing, and in no distress, oriented to person, place, and time and normal appearing weight Heart - normal rate and regular rhythm Abdomen - soft, nontender, nondistended Fundal Height:  size equals dates Cervical Exam: Not evaluated. and yesterday was found to be closed firm 40%/ /-2 and fetal presentation is cephalic. Extremities: extremities normal, atraumatic, no cyanosis or edema and Homans sign is negative, no sign of DVT with DTRs 2+ bilaterally Membranes:intact  Fetal Monitoring:  Baseline: 140 bpm, Variability: Fair (1-6 bpm), Accelerations: Non-reactive but appropriate for gestational age and Decelerations: Variable: mild  Labs:  Results for orders placed or performed during the hospital encounter of 10/01/17 (from the past 24 hour(s))  Glucose, capillary   Collection Time: 10/03/17 11:35  AM  Result Value Ref Range   Glucose-Capillary 74 65 - 99 mg/dL   Comment 1 Notify RN    Comment 2 Document in Chart   Glucose, capillary   Collection Time: 10/03/17  3:28 PM  Result Value Ref Range   Glucose-Capillary 101 (H) 65 - 99 mg/dL   Comment 1 Notify RN   CBC   Collection Time: 10/03/17  6:20 PM  Result Value Ref Range   WBC 7.2 4.0 - 10.5 K/uL   RBC 3.44 (L) 3.87 - 5.11 MIL/uL   Hemoglobin 10.9 (L) 12.0 - 15.0 g/dL   HCT 40.9 (L) 81.1 - 91.4 %   MCV 90.4 78.0 - 100.0 fL   MCH 31.7 26.0 - 34.0 pg   MCHC 35.0 30.0 - 36.0 g/dL   RDW 78.2 95.6 - 21.3 %   Platelets 203 150 - 400 K/uL  Comprehensive metabolic panel   Collection Time: 10/03/17  6:20 PM  Result Value Ref Range   Sodium 135 135 - 145 mmol/L   Potassium 4.1 3.5 - 5.1 mmol/L   Chloride 107 101 - 111 mmol/L   CO2 21 (L) 22 - 32 mmol/L   Glucose, Bld 70 65 - 99 mg/dL   BUN 8 6 - 20 mg/dL   Creatinine, Ser 0.86 0.44 - 1.00 mg/dL   Calcium 8.3 (L) 8.9 - 10.3 mg/dL   Total Protein 6.6 6.5 - 8.1 g/dL   Albumin 2.7 (L) 3.5 - 5.0 g/dL   AST 29 15 - 41 U/L   ALT 34 14 - 54 U/L   Alkaline Phosphatase 70 38 - 126 U/L   Total Bilirubin  0.2 (L) 0.3 - 1.2 mg/dL   GFR calc non Af Amer >60 >60 mL/min   GFR calc Af Amer >60 >60 mL/min   Anion gap 7 5 - 15  Glucose, capillary   Collection Time: 10/04/17 12:01 AM  Result Value Ref Range   Glucose-Capillary 92 65 - 99 mg/dL  Glucose, capillary   Collection Time: 10/04/17  6:12 AM  Result Value Ref Range   Glucose-Capillary 66 65 - 99 mg/dL   CMP Latest Ref Rng & Units 10/03/2017 10/01/2017 09/28/2017  Glucose 65 - 99 mg/dL 70 76 94  BUN 6 - 20 mg/dL Creatinine 0.44 - 1.00 mg/dL 1.61 0.96 0.45  Sodium 135 - 145 mmol/L 135 135 133(L)  Potassium 3.5 - 5.1 mmol/L 4.1 4.2 4.4  Chloride 101 - 111 mmol/L 107 105 102  CO2 22 - 32 mmol/L 21(L) 20(L) 18(L)  Calcium 8.9 - 10.3 mg/dL 8.3(L) 8.6(L) 8.9  Total Protein 6.5 - 8.1 g/dL 6.6 6.9 7.8  Total Bilirubin 0.3 -  1.2 mg/dL 4.0(J) 0.3 1.0  Alkaline Phos 38 - 126 U/L 70 68 78  AST 15 - 41 U/L 29 26 35  ALT 14 - 54 U/L 34 30 29  ; Imaging Studies:     Currently EPIC will not allow sonographic studies to automatically populate into notes.  In the meantime, copy and paste results into note or free text.  Medications:  Scheduled . aspirin EC  81 mg Oral Daily  . docusate sodium  100 mg Oral Daily  . enoxaparin (LOVENOX) injection  40 mg Subcutaneous Q24H  . NIFEdipine  60 mg Oral BID  . prenatal multivitamin  1 tablet Oral Q1200  . sodium chloride flush  3 mL Intravenous Q12H   I have reviewed the patient's current medications.  ASSESSMENT: Patient Active Problem List   Diagnosis Date Noted  . Chronic hypertension with exacerbation during pregnancy in third trimester 10/01/2017  . Gestational diabetes 09/21/2017  . Chronic hypertension with superimposed pre-eclampsia 09/21/2017  . Supervision of high risk pregnancy, antepartum 07/01/2017  . Chronic hypertension during pregnancy, antepartum 07/01/2017    PLAN: Continued inpt care.  Repeat bpp Monday. Consider outpt care again.  Tilda Burrow 10/04/2017,7:43 AM

## 2017-10-04 NOTE — Progress Notes (Signed)
Pt. Refused at 2002.

## 2017-10-04 NOTE — Progress Notes (Signed)
Dr. Despina Hidden notified of pt having 8 ctx in 1 hour, about 6-10 minutes apart. Pt reports she does not feel them. Dr. Despina Hidden said it was ok to take pt off of monitor. Informed pt to notify RN if she begins to feel any back or lower abdominal discomfort. Will continue to monitor.

## 2017-10-05 ENCOUNTER — Encounter: Payer: Medicaid Other | Admitting: Obstetrics & Gynecology

## 2017-10-05 ENCOUNTER — Other Ambulatory Visit: Payer: Medicaid Other

## 2017-10-05 LAB — GLUCOSE, CAPILLARY
GLUCOSE-CAPILLARY: 57 mg/dL — AB (ref 65–99)
GLUCOSE-CAPILLARY: 54 mg/dL — AB (ref 65–99)
Glucose-Capillary: 80 mg/dL (ref 65–99)
Glucose-Capillary: 87 mg/dL (ref 65–99)

## 2017-10-05 LAB — CULTURE, BETA STREP (GROUP B ONLY)

## 2017-10-05 NOTE — Discharge Summary (Signed)
Physician Discharge Summary  Patient ID: Amanda Meza MRN: [id number] DOB/AGE: Dec 15, 1990 27 y.o.  Admit date: 10/01/2017 Discharge date: 10/05/2017  Admission Diagnoses: CHTN with exaserbation in thrid trimester Discharge Diagnoses:  Active Problems:   Chronic hypertension with exacerbation during pregnancy in third trimester   Discharged Condition: stable  Hospital Course: pt was admitted to the hospital for BP monitoring and fetal surveillance.  Her BP was increased to procardia xl 60 BID and her BP stabilized.  Fetal status was reassuring.  Lab data and clinically patient was stable. She was discharged with a short follow up interval and return if any CNS symptoms or problems arose  Consults:   Significant Diagnostic Studies: fetal surveillance  Treatments: BP control fetal monitoring  Discharge Exam: Blood pressure 136/90, pulse 73, temperature 98.5 F (36.9 C), temperature source Oral, resp. rate 16, height  (1.651 m), weight 172 lb (78 kg), last menstrual period 03/03/2017, SpO2 100 %. General appearance: alert, cooperative and no distress GI: soft, non-tender; bowel sounds normal; no masses,  no organomegaly  Disposition: Discharge disposition: 01-Home or Self Care       Discharge Instructions    Diet - low sodium heart healthy   Complete by:  As directed    Increase activity slowly   Complete by:  As directed      Allergies as of 10/05/2017      Reactions   Penicillins Rash   Has patient had a PCN reaction causing immediate rash, facial/tongue/throat swelling, SOB or lightheadedness with hypotension: Yes Has patient had a PCN reaction causing severe rash involving mucus membranes or skin necrosis: Yes Has patient had a PCN reaction that required hospitalization: No Has patient had a PCN reaction occurring within the last 10 years: No If all of the above answers are "NO", then may proceed with Cephalosporin use.      Medication List    STOP  taking these medications   labetalol 200 MG tablet Commonly known as:  NORMODYNE     TAKE these medications   aspirin EC 81 MG tablet Take 1 tablet (81 mg total) by mouth daily.   NIFEdipine 60 MG 24 hr tablet Commonly known as:  PROCARDIA-XL/ADALAT CC Take 1 tablet (60 mg total) by mouth 2 (two) times daily.   ondansetron 4 MG disintegrating tablet Commonly known as:  ZOFRAN ODT Take 1 tablet (4 mg total) by mouth every 8 (eight) hours as needed for nausea or vomiting.      Follow-up Information    [facility] [place] Follow up on 10/08/2017.   Why:  high risk clinic Contact information: [address] [place] [address] [phone]          Signed: Lazaro Arms 10/05/2017, 8:10 AM

## 2017-10-05 NOTE — Progress Notes (Signed)
Pt Sleeping

## 2017-10-05 NOTE — Discharge Instructions (Signed)
Gestational Diabetes Mellitus, Diagnosis Gestational diabetes (gestational diabetes mellitus) is a short-term (temporary) form of diabetes that can happen during pregnancy. It goes away after you give birth. It may be caused by one or both of these problems:  Your body does not make enough of a hormone called insulin.  Your body does not respond in a normal way to insulin that it makes.  Insulin lets sugars (glucose) go into cells in the body. This gives you energy. If you have diabetes, sugars cannot get into cells. This causes high blood sugar (hyperglycemia). If diabetes is treated, it may not hurt you or your baby. Your doctor will set treatment goals for you. In general, you should have these blood sugar levels:  After not eating for a long time (fasting): 95 mg/dL (5.3 mmol/L).  After meals (postprandial): ? One hour after a meal: at or below 140 mg/dL (7.8 mmol/L). ? Two hours after a meal: at or below 120 mg/dL (6.7 mmol/L).  A1c (hemoglobin A1c) level: 6-6.5%.  Follow these instructions at home: Questions to Ask Your Doctor  You may want to ask these questions:  Do I need to meet with a diabetes educator?  Where can I find a support group for people with diabetes?  What equipment will I need to care for myself at home?  What diabetes medicines do I need? When should I take them?  How often do I need to check my blood sugar?  What number can I call if I have questions?  When is my next doctor's visit?  General instructions  Take over-the-counter and prescription medicines only as told by your doctor.  Stay at a healthy weight during pregnancy.  Keep all follow-up visits as told by your doctor. This is important. Contact a doctor if:  Your blood sugar is at or above 240 mg/dL (47.8 mmol/L).  Your blood sugar is at or above 200 mg/dL (29.5 mmol/L) and you have ketones in your pee (urine).  You have been sick or have had a fever for 2 days or more and you are  not getting better.  You have any of these problems for more than 6 hours: ? You cannot eat or drink. ? You feel sick to your stomach (nauseous). ? You throw up (vomit). ? You have watery poop (diarrhea). Get help right away if:  Your blood sugar is lower than 54 mg/dL (3 mmol/L).  You get confused.  You have trouble: ? Thinking clearly. ? Breathing.  Your baby moves less than normal.  You have: ? Moderate or large ketone levels in your pee (urine). ? Bleeding from your vagina. ? Unusual fluid coming from your vagina. ? Early contractions. These may feel like tightness in your belly. This information is not intended to replace advice given to you by your health care provider. Make sure you discuss any questions you have with your health care provider. Document Released: 08/27/2015 Document Revised: 10/11/2015 Document Reviewed: 06/08/2015 Elsevier Interactive Patient Education  2018 [employer].  Preeclampsia and Eclampsia Preeclampsia is a serious condition that develops only during pregnancy. It is also called toxemia of pregnancy. This condition causes high blood pressure along with other symptoms, such as swelling and headaches. These symptoms may develop as the condition gets worse. Preeclampsia may occur at 20 weeks of pregnancy or later. Diagnosing and treating preeclampsia early is very important. If not treated early, it can cause serious problems for you and your baby. One problem it can lead to is  eclampsia, which is a condition that causes muscle jerking or shaking (convulsions or seizures) in the mother. Delivering your baby is the best treatment for preeclampsia or eclampsia. Preeclampsia and eclampsia symptoms usually go away after your baby is born. What are the causes? The cause of preeclampsia is not known. What increases the risk? The following risk factors make you more likely to develop preeclampsia:  Being pregnant for the first time.  Having had  preeclampsia during a past pregnancy.  Having a family history of preeclampsia.  Having high blood pressure.  Being pregnant with twins or triplets.  Being 9 or older.  Being African-American.  Having kidney disease or diabetes.  Having medical conditions such as lupus or blood diseases.  Being very overweight (obese).  What are the signs or symptoms? The earliest signs of preeclampsia are:  High blood pressure.  Increased protein in your urine. Your health care provider will check for this at every visit before you give birth (prenatal visit).  Other symptoms that may develop as the condition gets worse include:  Severe headaches.  Sudden weight gain.  Swelling of the hands, face, legs, and feet.  Nausea and vomiting.  Vision problems, such as blurred or double vision.  Numbness in the face, arms, legs, and feet.  Urinating less than usual.  Dizziness.  Slurred speech.  Abdominal pain, especially upper abdominal pain.  Convulsions or seizures.  Symptoms generally go away after giving birth. How is this diagnosed? There are no screening tests for preeclampsia. Your health care provider will ask you about symptoms and check for signs of preeclampsia during your prenatal visits. You may also have tests that include:  Urine tests.  Blood tests.  Checking your blood pressure.  Monitoring your babys heart rate.  Ultrasound.  How is this treated? You and your health care provider will determine the treatment approach that is best for you. Treatment may include:  Having more frequent prenatal exams to check for signs of preeclampsia, if you have an increased risk for preeclampsia.  Bed rest.  Reducing how much salt (sodium) you eat.  Medicine to lower your blood pressure.  Staying in the hospital, if your condition is severe. There, treatment will focus on controlling your blood pressure and the amount of fluids in your body (fluid  retention).  You may need to take medicine (magnesium sulfate) to prevent seizures. This medicine may be given as an injection or through an IV tube.  Delivering your baby early, if your condition gets worse. You may have your labor started with medicine (induced), or you may have a cesarean delivery.  Follow these instructions at home: Eating and drinking   Drink enough fluid to keep your urine clear or pale yellow.  Eat a healthy diet that is low in sodium. Do not add salt to your food. Check nutrition labels to see how much sodium a food or beverage contains.  Avoid caffeine. Lifestyle  Do not use any products that contain nicotine or tobacco, such as cigarettes and e-cigarettes. If you need help quitting, ask your health care provider.  Do not use alcohol or drugs.  Avoid stress as much as possible. Rest and get plenty of sleep. General instructions  Take over-the-counter and prescription medicines only as told by your health care provider.  When lying down, lie on your side. This keeps pressure off of your baby.  When sitting or lying down, raise (elevate) your feet. Try putting some pillows underneath your lower legs.  Exercise regularly. Ask your health care provider what kinds of exercise are best for you.  Keep all follow-up and prenatal visits as told by your health care provider. This is important. How is this prevented? To prevent preeclampsia or eclampsia from developing during another pregnancy:  Get proper medical care during pregnancy. Your health care provider may be able to prevent preeclampsia or diagnose and treat it early.  Your health care provider may have you take a low-dose aspirin or a calcium supplement during your next pregnancy.  You may have tests of your blood pressure and kidney function after giving birth.  Maintain a healthy weight. Ask your health care provider for help managing weight gain during pregnancy.  Work with your health care  provider to manage any long-term (chronic) health conditions you have, such as diabetes or kidney problems.  Contact a health care provider if:  You gain more weight than expected.  You have headaches.  You have nausea or vomiting.  You have abdominal pain.  You feel dizzy or light-headed. Get help right away if:  You develop sudden or severe swelling anywhere in your body. This usually happens in the legs.  You gain 5 lbs (2.3 kg) or more during one week.  You have severe: ? Abdominal pain. ? Headaches. ? Dizziness. ? Vision problems. ? Confusion. ? Nausea or vomiting.  You have a seizure.  You have trouble moving any part of your body.  You develop numbness in any part of your body.  You have trouble speaking.  You have any abnormal bleeding.  You pass out. This information is not intended to replace advice given to you by your health care provider. Make sure you discuss any questions you have with your health care provider. Document Released: 05/02/2000 Document Revised: 01/01/2016 Document Reviewed: 12/10/2015 Elsevier Interactive Patient Education  [employer].

## 2017-10-13 ENCOUNTER — Other Ambulatory Visit: Payer: Medicaid Other

## 2017-10-13 ENCOUNTER — Telehealth: Payer: Self-pay | Admitting: *Deleted

## 2017-10-13 ENCOUNTER — Encounter (HOSPITAL_COMMUNITY): Payer: Self-pay

## 2017-10-13 DIAGNOSIS — O119 Pre-existing hypertension with pre-eclampsia, unspecified trimester: Secondary | ICD-10-CM

## 2017-10-13 DIAGNOSIS — O24410 Gestational diabetes mellitus in pregnancy, diet controlled: Secondary | ICD-10-CM

## 2017-10-13 NOTE — Telephone Encounter (Addendum)
Called pt regarding missed appt today for NST/BPP @ 1315. I left message on her personal voicemail that she should go to [facility] if she is not feeling well (Sx of pre-e) or if her baby is not moving well every Angelise Petrich. She should keep appt tomorrow @ [facility] for [place] as scheduled (BPP added). Her next appt in our office is on 6/3 @ 0815. She may call back if she has questions.    1525  Pt called back and stated that she is very anxious every time she comes to hospital for a visit and worried that her BP will be elevated and the recommendation for admission will be made. She does not want to be admitted again. She is also upset that she was given conflicting information about having Gestational Diabetes. I confirmed that she does have GDM and currently is well-controlled on diet. We discussed her plan of care in detail and I tried to provide supportive information without causing additional anxiety. Pt states the baby is moving well daily and she does not have H/A or visual disturbances. Pt will keep appt tomorrow for US/BPP @ [facility] and next week @ office on 6/3.

## 2017-10-14 ENCOUNTER — Ambulatory Visit (HOSPITAL_COMMUNITY)
Admission: RE | Admit: 2017-10-14 | Discharge: 2017-10-14 | Disposition: A | Discharge status: Home / Self Care | Payer: Medicaid Other | Source: Ambulatory Visit | Attending: Obstetrics & Gynecology | Admitting: Obstetrics & Gynecology

## 2017-10-14 HISTORY — DX: Gestational diabetes mellitus in pregnancy, unspecified control: O24.419

## 2017-10-15 ENCOUNTER — Encounter: Payer: Medicaid Other | Admitting: Obstetrics & Gynecology

## 2017-10-19 ENCOUNTER — Encounter: Payer: Medicaid Other | Admitting: Obstetrics & Gynecology

## 2017-10-19 ENCOUNTER — Telehealth: Payer: Self-pay | Admitting: Obstetrics & Gynecology

## 2017-10-19 ENCOUNTER — Other Ambulatory Visit: Payer: Medicaid Other

## 2017-10-19 NOTE — Telephone Encounter (Signed)
1015 - I called pt back to inform her that her ultrasound is actually scheduled for tomorrow @ 1530 and not on Thursday as she thought. I also had called [facility] dept to see if an appt was available for Thursday or Friday since she is currently out of town however there is not. Also our office does not have appt availability on Thursday as she requested to Dr. Marice Potterove. Pt endorses good FM daily and also that she feels fine other than being tired all the time. She is not checking her BP. She denies H/A or visual disturbances. Pt stated that she will see if she can get back in town tomorrow for the US appt. She stated that she will keep office appt as scheduled on 6/10.

## 2017-10-19 NOTE — Telephone Encounter (Signed)
She missed another appt today. I called her and she said that she is out of town. She told me that she has an u/s this Thursday and would be willing to come to appt here in [facility] on that same day. I will have the front office make her an appt on Thursday.

## 2017-10-20 ENCOUNTER — Encounter (HOSPITAL_COMMUNITY): Payer: Self-pay

## 2017-10-20 ENCOUNTER — Other Ambulatory Visit: Payer: Self-pay

## 2017-10-20 ENCOUNTER — Ambulatory Visit (HOSPITAL_COMMUNITY)
Admission: RE | Admit: 2017-10-20 | Discharge: 2017-10-20 | Disposition: A | Discharge status: Home / Self Care | Payer: Medicaid Other | Source: Ambulatory Visit | Attending: Obstetrics & Gynecology | Admitting: Obstetrics & Gynecology

## 2017-10-20 ENCOUNTER — Inpatient Hospital Stay (HOSPITAL_COMMUNITY)
Admission: AD | Admit: 2017-10-20 | Discharge: 2017-10-25 | DRG: 788 | Disposition: A | Discharge status: Home / Self Care | Payer: Medicaid Other | Attending: Family Medicine | Admitting: Family Medicine

## 2017-10-20 DIAGNOSIS — O114 Pre-existing hypertension with pre-eclampsia, complicating childbirth: Principal | ICD-10-CM | POA: Diagnosis present

## 2017-10-20 DIAGNOSIS — Z880 Allergy status to penicillin: Secondary | ICD-10-CM | POA: Diagnosis not present

## 2017-10-20 DIAGNOSIS — O24419 Gestational diabetes mellitus in pregnancy, unspecified control: Secondary | ICD-10-CM | POA: Diagnosis present

## 2017-10-20 DIAGNOSIS — O24420 Gestational diabetes mellitus in childbirth, diet controlled: Secondary | ICD-10-CM | POA: Diagnosis present

## 2017-10-20 DIAGNOSIS — Z3A33 33 weeks gestation of pregnancy: Secondary | ICD-10-CM

## 2017-10-20 DIAGNOSIS — Z7982 Long term (current) use of aspirin: Secondary | ICD-10-CM | POA: Diagnosis not present

## 2017-10-20 DIAGNOSIS — Z3483 Encounter for supervision of other normal pregnancy, third trimester: Secondary | ICD-10-CM | POA: Diagnosis present

## 2017-10-20 DIAGNOSIS — O1424 HELLP syndrome, complicating childbirth: Secondary | ICD-10-CM | POA: Diagnosis not present

## 2017-10-20 DIAGNOSIS — O1002 Pre-existing essential hypertension complicating childbirth: Secondary | ICD-10-CM | POA: Diagnosis present

## 2017-10-20 DIAGNOSIS — O119 Pre-existing hypertension with pre-eclampsia, unspecified trimester: Secondary | ICD-10-CM | POA: Diagnosis present

## 2017-10-20 DIAGNOSIS — O0990 Supervision of high risk pregnancy, unspecified, unspecified trimester: Secondary | ICD-10-CM

## 2017-10-20 HISTORY — DX: Gestational (pregnancy-induced) hypertension without significant proteinuria, unspecified trimester: O13.9

## 2017-10-20 LAB — URINALYSIS, ROUTINE W REFLEX MICROSCOPIC
BACTERIA UA: NONE SEEN
GLUCOSE, UA: NEGATIVE mg/dL
KETONES UR: 5 mg/dL — AB
Leukocytes, UA: NEGATIVE
NITRITE: NEGATIVE
Specific Gravity, Urine: 1.02 (ref 1.005–1.030)
pH: 5 (ref 5.0–8.0)

## 2017-10-20 LAB — COMPREHENSIVE METABOLIC PANEL
ALK PHOS: 152 U/L — AB (ref 38–126)
ALK PHOS: 220 U/L — AB (ref 38–126)
ALK PHOS: 142 U/L — AB (ref 38–126)
ALT: 199 U/L — ABNORMAL HIGH (ref 14–54)
ALT: 201 U/L — AB (ref 14–54)
ALT: 217 U/L — AB (ref 14–54)
AST: 175 U/L — AB (ref 15–41)
AST: 186 U/L — AB (ref 15–41)
AST: 193 U/L — AB (ref 15–41)
Albumin: 2.7 g/dL — ABNORMAL LOW (ref 3.5–5.0)
Albumin: 2.8 g/dL — ABNORMAL LOW (ref 3.5–5.0)
Albumin: 2.6 g/dL — ABNORMAL LOW (ref 3.5–5.0)
Anion gap: 13 (ref 5–15)
Anion gap: 14 (ref 5–15)
Anion gap: 14 (ref 5–15)
BILIRUBIN TOTAL: 2.3 mg/dL — AB (ref 0.3–1.2)
BILIRUBIN TOTAL: 2 mg/dL — AB (ref 0.3–1.2)
BUN: 18 mg/dL (ref 6–20)
BUN: 18 mg/dL (ref 6–20)
BUN: 18 mg/dL (ref 6–20)
CALCIUM: 8.3 mg/dL — AB (ref 8.9–10.3)
CALCIUM: 8.5 mg/dL — AB (ref 8.9–10.3)
CALCIUM: 8.3 mg/dL — AB (ref 8.9–10.3)
CHLORIDE: 105 mmol/L (ref 101–111)
CHLORIDE: 106 mmol/L (ref 101–111)
CO2: 17 mmol/L — AB (ref 22–32)
CO2: 17 mmol/L — ABNORMAL LOW (ref 22–32)
CO2: 17 mmol/L — ABNORMAL LOW (ref 22–32)
CREATININE: 1.16 mg/dL — AB (ref 0.44–1.00)
CREATININE: 1 mg/dL (ref 0.44–1.00)
Chloride: 104 mmol/L (ref 101–111)
Creatinine, Ser: 1.16 mg/dL — ABNORMAL HIGH (ref 0.44–1.00)
GFR calc Af Amer: >60 mL/min (ref >60)
GFR calc Af Amer: >60 mL/min (ref >60)
GFR calc non Af Amer: >60 mL/min (ref >60)
GFR calc non Af Amer: >60 mL/min (ref >60)
GLUCOSE: 68 mg/dL (ref 65–99)
GLUCOSE: 105 mg/dL — AB (ref 65–99)
Glucose, Bld: 72 mg/dL (ref 65–99)
POTASSIUM: 4.1 mmol/L (ref 3.5–5.1)
Potassium: 3.9 mmol/L (ref 3.5–5.1)
Potassium: 3.9 mmol/L (ref 3.5–5.1)
SODIUM: 136 mmol/L (ref 135–145)
Sodium: 136 mmol/L (ref 135–145)
Sodium: 135 mmol/L (ref 135–145)
TOTAL PROTEIN: 7 g/dL (ref 6.5–8.1)
Total Bilirubin: 2.1 mg/dL — ABNORMAL HIGH (ref 0.3–1.2)
Total Protein: 7.2 g/dL (ref 6.5–8.1)
Total Protein: 6.6 g/dL (ref 6.5–8.1)

## 2017-10-20 LAB — CBC
HCT: 32.6 % — ABNORMAL LOW (ref 36.0–46.0)
HEMATOCRIT: 32.9 % — AB (ref 36.0–46.0)
HEMOGLOBIN: 12.2 g/dL (ref 12.0–15.0)
HEMOGLOBIN: 12 g/dL (ref 12.0–15.0)
MCH: 30.9 pg (ref 26.0–34.0)
MCH: 30.8 pg (ref 26.0–34.0)
MCHC: 37.1 g/dL — ABNORMAL HIGH (ref 30.0–36.0)
MCHC: 36.8 g/dL — ABNORMAL HIGH (ref 30.0–36.0)
MCV: 83.3 fL (ref 78.0–100.0)
MCV: 83.6 fL (ref 78.0–100.0)
PLATELETS: 63 10*3/uL — AB (ref 150–400)
Platelets: 65 10*3/uL — ABNORMAL LOW (ref 150–400)
RBC: 3.95 MIL/uL (ref 3.87–5.11)
RBC: 3.9 MIL/uL (ref 3.87–5.11)
RDW: 13.7 % (ref 11.5–15.5)
RDW: 14 % (ref 11.5–15.5)
WBC: 9.2 10*3/uL (ref 4.0–10.5)
WBC: 10.7 10*3/uL — AB (ref 4.0–10.5)

## 2017-10-20 LAB — PROTEIN / CREATININE RATIO, URINE
CREATININE, URINE: 353 mg/dL
Total Protein, Urine: <6 mg/dL

## 2017-10-20 LAB — GLUCOSE, CAPILLARY
Glucose-Capillary: 71 mg/dL (ref 65–99)
Glucose-Capillary: 94 mg/dL (ref 65–99)

## 2017-10-20 LAB — TYPE AND SCREEN
ABO/RH(D): A POS
ANTIBODY SCREEN: NEGATIVE

## 2017-10-20 LAB — MAGNESIUM: Magnesium: 5.9 mg/dL — ABNORMAL HIGH (ref 1.7–2.4)

## 2017-10-20 MED ORDER — BETAMETHASONE SOD PHOS & ACET 6 (3-3) MG/ML IJ SUSP
12.0000 mg | INTRAMUSCULAR | Status: DC
  Administered 2017-10-20: 12 mg via INTRAMUSCULAR
  Filled 2017-10-20: qty 2

## 2017-10-20 MED ORDER — SODIUM CHLORIDE 0.9 % IV SOLN: INTRAVENOUS | Status: DC

## 2017-10-20 MED ORDER — LABETALOL HCL 5 MG/ML IV SOLN
20.0000 mg | INTRAVENOUS | Status: AC | PRN
  Administered 2017-10-20: 20 mg via INTRAVENOUS
  Administered 2017-10-20: 40 mg via INTRAVENOUS
  Administered 2017-10-22: 20 mg via INTRAVENOUS
  Filled 2017-10-20: qty 8
  Filled 2017-10-20 (×2): qty 4

## 2017-10-20 MED ORDER — LACTATED RINGERS IV SOLN: 500.0000 mL | INTRAVENOUS | Status: DC | PRN

## 2017-10-20 MED ORDER — OXYTOCIN 40 UNITS IN LACTATED RINGERS INFUSION - SIMPLE MED: 2.5000 [IU]/h | INTRAVENOUS | Status: DC

## 2017-10-20 MED ORDER — DOCUSATE SODIUM 100 MG PO CAPS: 100.0000 mg | ORAL_CAPSULE | Freq: Every day | ORAL | Status: DC

## 2017-10-20 MED ORDER — MAGNESIUM SULFATE BOLUS VIA INFUSION
4.0000 g | Freq: Once | INTRAVENOUS | Status: AC
  Administered 2017-10-20: 4 g via INTRAVENOUS
  Filled 2017-10-20: qty 500

## 2017-10-20 MED ORDER — NIFEDIPINE ER OSMOTIC RELEASE 30 MG PO TB24
60.0000 mg | ORAL_TABLET | Freq: Two times a day (BID) | ORAL | Status: DC
  Administered 2017-10-20: 60 mg via ORAL
  Filled 2017-10-20 (×2): qty 1

## 2017-10-20 MED ORDER — CEFAZOLIN SODIUM-DEXTROSE 2-4 GM/100ML-% IV SOLN: 2.0000 g | Freq: Once | INTRAVENOUS | Status: DC

## 2017-10-20 MED ORDER — ONDANSETRON HCL 4 MG/2ML IJ SOLN
4.0000 mg | Freq: Four times a day (QID) | INTRAMUSCULAR | Status: DC | PRN
  Administered 2017-10-20: 4 mg via INTRAVENOUS
  Filled 2017-10-20: qty 2

## 2017-10-20 MED ORDER — CALCIUM CARBONATE ANTACID 500 MG PO CHEW: 2.0000 | CHEWABLE_TABLET | ORAL | Status: DC | PRN

## 2017-10-20 MED ORDER — LABETALOL HCL 5 MG/ML IV SOLN
20.0000 mg | INTRAVENOUS | Status: AC | PRN
  Administered 2017-10-20 (×3): 20 mg via INTRAVENOUS
  Filled 2017-10-20 (×3): qty 4

## 2017-10-20 MED ORDER — OXYTOCIN BOLUS FROM INFUSION: 500.0000 mL | Freq: Once | INTRAVENOUS | Status: DC

## 2017-10-20 MED ORDER — CEFAZOLIN SODIUM-DEXTROSE 1-4 GM/50ML-% IV SOLN: 1.0000 g | Freq: Three times a day (TID) | INTRAVENOUS | Status: DC

## 2017-10-20 MED ORDER — HYDRALAZINE HCL 20 MG/ML IJ SOLN
10.0000 mg | Freq: Once | INTRAMUSCULAR | Status: AC | PRN
  Administered 2017-10-20: 10 mg via INTRAVENOUS
  Filled 2017-10-20: qty 1

## 2017-10-20 MED ORDER — ACETAMINOPHEN 325 MG PO TABS: 650.0000 mg | ORAL_TABLET | ORAL | Status: DC | PRN

## 2017-10-20 MED ORDER — OXYCODONE-ACETAMINOPHEN 5-325 MG PO TABS: 1.0000 | ORAL_TABLET | ORAL | Status: DC | PRN

## 2017-10-20 MED ORDER — HYDRALAZINE HCL 20 MG/ML IJ SOLN
10.0000 mg | Freq: Once | INTRAMUSCULAR | Status: AC | PRN
  Administered 2017-10-22: 10 mg via INTRAVENOUS
  Filled 2017-10-20: qty 1

## 2017-10-20 MED ORDER — LIDOCAINE HCL (PF) 1 % IJ SOLN: 30.0000 mL | INTRAMUSCULAR | Status: DC | PRN

## 2017-10-20 MED ORDER — FENTANYL CITRATE (PF) 100 MCG/2ML IJ SOLN: 100.0000 ug | INTRAMUSCULAR | Status: DC | PRN

## 2017-10-20 MED ORDER — ZOLPIDEM TARTRATE 5 MG PO TABS: 5.0000 mg | ORAL_TABLET | Freq: Every evening | ORAL | Status: DC | PRN

## 2017-10-20 MED ORDER — SOD CITRATE-CITRIC ACID 500-334 MG/5ML PO SOLN
30.0000 mL | ORAL | Status: DC | PRN
  Administered 2017-10-21: 30 mL via ORAL
  Filled 2017-10-20: qty 15

## 2017-10-20 MED ORDER — PRENATAL MULTIVITAMIN CH: 1.0000 | ORAL_TABLET | Freq: Every day | ORAL | Status: DC

## 2017-10-20 MED ORDER — SODIUM CHLORIDE 0.9 % IV SOLN
INTRAVENOUS | Status: DC
  Administered 2017-10-20: 15:00:00 via INTRAVENOUS

## 2017-10-20 MED ORDER — MAGNESIUM SULFATE 40 G IN LACTATED RINGERS - SIMPLE
2.0000 g/h | INTRAVENOUS | Status: DC
  Filled 2017-10-20: qty 500

## 2017-10-20 MED ORDER — MISOPROSTOL 25 MCG QUARTER TABLET
25.0000 ug | ORAL_TABLET | ORAL | Status: DC | PRN
  Administered 2017-10-20: 25 ug via VAGINAL
  Filled 2017-10-20: qty 1

## 2017-10-20 MED ORDER — LACTATED RINGERS IV SOLN
INTRAVENOUS | Status: DC
  Administered 2017-10-20: 17:00:00 via INTRAVENOUS

## 2017-10-20 MED ORDER — OXYCODONE-ACETAMINOPHEN 5-325 MG PO TABS: 2.0000 | ORAL_TABLET | ORAL | Status: DC | PRN

## 2017-10-20 MED ORDER — TERBUTALINE SULFATE 1 MG/ML IJ SOLN: 0.2500 mg | Freq: Once | INTRAMUSCULAR | Status: DC | PRN

## 2017-10-20 NOTE — Progress Notes (Signed)
Transferred to birthing suites room 168.  Patient tolerated the magnesium bolus well.

## 2017-10-20 NOTE — Progress Notes (Signed)
LABOR PROGRESS NOTE  Amanda Meza is a 27 y.o. G1P0 at 3754w0d  admitted for cHTN with SIPE, GDM  Subjective: Pt is comfortable. No complaints of HA, CP, SOB  Objective: BP (!) 159/104   Pulse 83   Temp 98.5 F (36.9 C) (Oral)   Resp 16   Ht 5\' 5"  (1.651 m)   Wt 176 lb (79.8 kg)   LMP 03/03/2017 (Exact Date)   SpO2 100%   BMI 29.29 kg/m  or  Vitals:   10/20/17 2134 10/20/17 2152 10/20/17 2201 10/20/17 2301  BP: (!) 165/103 (!) 139/95 (!) 149/92 (!) 159/104  Pulse: 81 82 86 83  Resp:      Temp:      TempSrc:      SpO2:      Weight:      Height:         Dilation: Fingertip Effacement (%): Thick Cervical Position: Anterior Station: -2 Presentation: Vertex Exam by:: Janee Mornhompson, Degele FHT: 130, mod var, no accel, late decel Uterine activity: 2-4 min  Labs: Lab Results  Component Value Date   WBC 10.7 (H) 10/20/2017   HGB 12.0 10/20/2017   HCT 32.6 (L) 10/20/2017   MCV 83.6 10/20/2017   PLT 63 (L) 10/20/2017    Patient Active Problem List   Diagnosis Date Noted  . Chronic hypertension with superimposed preeclampsia 10/20/2017  . Gestational diabetes 09/21/2017  . Chronic hypertension with superimposed pre-eclampsia 09/21/2017  . Supervision of high risk pregnancy, antepartum 07/01/2017  . Chronic hypertension during pregnancy, antepartum 07/01/2017    Assessment / Plan: 27 y.o. G1P0 at 954w0d here for IOL for  admitted for cHTN with SIPE, GDM  Labor: augmenting w/ FB, s/;p cytotec Fetal Wellbeing:  Cat II with multiple late decels, will assess how tolerating FB Pain Control:  supportive Anticipated MOD:  NSVD vs. C-section for NRHT/PreE  Garnette GunnerAaron B Eugene Isadore, MD PGY-1 6/4/201911:15 PM

## 2017-10-20 NOTE — H&P (Signed)
Amanda Meza is a 27 y.o. G1P0 at [redacted]w[redacted]d presenting to MAU with complaints of possible contractions since last night. She take Procardia 60 mg daily and a baby aspirin for cHTN with severe PEC. She "felt worse this morning, so she decided to come on it to be seen". She receives [facility] in [facility] and was last seen 2 wks ago.  OB History    Gravida  1   Para      Term      Preterm      AB      Living        SAB      TAB      Ectopic      Multiple      Live Births             Past Medical History:  Diagnosis Date  . Anxiety   . Gestational diabetes   . Hypertension    Past Surgical History:  Procedure Laterality Date  . NO PAST SURGERIES     Family History: family history is not on file. Social History:  reports that she has never smoked. She has never used smokeless tobacco. She reports that she does not drink alcohol or use drugs.     Maternal Diabetes: Yes:  Diabetes Type:  Diet controlled Genetic Screening: Normal Maternal Ultrasounds/Referrals: Normal Fetal Ultrasounds or other Referrals:  None Maternal Substance Abuse:  No Significant Maternal Medications:  Meds include: Other: Procardia 60 mg qd & Aspirin 81 mg qd Significant Maternal Lab Results:  Lab values include: Group B Strep negative Other Comments:  None  Review of Systems  Constitutional: Negative.   HENT: Negative.   Eyes: Negative.   Respiratory: Negative.   Cardiovascular: Negative.   Gastrointestinal: Negative.   Genitourinary:       Some UC's, "but not painful"  Musculoskeletal: Negative.   Skin: Negative.   Neurological: Negative.   Endo/Heme/Allergies: Negative.   Psychiatric/Behavioral: Negative.    History    Patient Vitals for the past 24 hrs:  BP Temp Temp src Pulse Resp Height Weight  10/20/17 1450 (!) 183/119 98.1 F (36.7 C) Oral (!) 123 20 - -  10/20/17 1443 (!) 214/126 - - (!) 152 - - -  10/20/17 1430 - - - - - 5\' 5"  (1.651 m) 176 lb (79.8 kg)    Blood  pressure (!) 183/119, pulse (!) 123, temperature 98.1 F (36.7 C), temperature source Oral, resp. rate 20, height 5\' 5"  (1.651 m), weight 176 lb (79.8 kg), last menstrual period 03/03/2017. Maternal Exam:  Uterine Assessment: Contraction strength is mild.  Contraction duration is 60 seconds. Contraction frequency is regular.   Abdomen: Patient reports no abdominal tenderness. Fetal presentation: vertex  Introitus: Normal vulva. Ferning test: not done.  Nitrazine test: not done. Amniotic fluid character: not assessed.  Pelvis: adequate for delivery.   Cervix: Cervix evaluated by digital exam.     Fetal Exam Fetal Monitor Review: Mode: ultrasound.   Baseline rate: 125.  Variability: moderate (6-25 bpm).   Pattern: accelerations present and no decelerations.    Fetal State Assessment: Category I - tracings are normal.     Physical Exam  Nursing note and vitals reviewed. Constitutional: She is oriented to person, place, and time. She appears well-developed and well-nourished.  HENT:  Head: Normocephalic and atraumatic.  Eyes: Pupils are equal, round, and reactive to light.  Neck: Normal range of motion.  Cardiovascular: Normal rate and regular rhythm.  Respiratory:  Effort normal and breath sounds normal.  GI: Soft. Bowel sounds are normal.  Genitourinary:  Genitourinary Comments: Dilation: Closed Effacement (%): 50 Station: Ballotable Presentation: Vertex Exam by: Raelyn Moraolitta Jayion Schneck CNM   Musculoskeletal: Normal range of motion.  Neurological: She is alert and oriented to person, place, and time. She has normal reflexes.  1 beat of clonus bilaterally  Skin: Skin is warm and dry.  Psychiatric: She has a normal mood and affect. Her behavior is normal. Judgment and thought content normal.    Prenatal labs: ABO, Rh: --/--/A POS (05/16 1227) Antibody: NEG (05/16 1227) Rubella: Immune (02/13 1136) RPR: Non Reactive (05/02 0840)  HBsAg: Negative (02/13 1136)  HIV: Non Reactive  (05/02 0840)  GBS:  Negative  *Consult with Dr. Debroah LoopArnold @ 1500 - notified of patient's complaints, assessments, lab results pending, recommended tx plan admit to Antenatal, given Apresoline  Assessment/Plan: Chronic hypertension with superimposed pre-eclampsia - Admit to Antenatal Unit - Routine Antenatal Admission orders - Dr. Debroah LoopArnold assumes care of patient upon admission     Raelyn Moraolitta Long Brimage, MSN, CNM 10/20/2017, 2:53 PM

## 2017-10-20 NOTE — Consult Note (Signed)
Asked by Dr.Stinson to provide updated prenatal consultation for 89 y.o. G1 mother who is now 6933 weeks EGA and will be induced for HELLP syndrome.  Has received BMZ 5/6 and 5/7 and will receive a second course during induction.  Previously patient spoke with Dr. Katrinka BlazingSmith (see note 5/18) who explained risks and likely course at 30 - 31 wks.  I spoke with patient and FOB about improved prognosis at 33 wks but told them about possible needs for DR resuscitation, respiratory support, IV access and projected possible length of stay in NICU until 37 - [redacted] wks EGA.  Discussed advantages of feeding with mother's milk.  She plans to pump postnatally.  Patient was drowsy due to Mg but FOB was attentive, had appropriate questions, and was appreciative of my input.  Thank you for consulting Neonatology.  Total time 25 minutes.  JWimmer, MD

## 2017-10-21 ENCOUNTER — Inpatient Hospital Stay (HOSPITAL_COMMUNITY): Payer: Medicaid Other | Admitting: Anesthesiology

## 2017-10-21 ENCOUNTER — Encounter (HOSPITAL_COMMUNITY): Payer: Self-pay | Admitting: *Deleted

## 2017-10-21 ENCOUNTER — Encounter (HOSPITAL_COMMUNITY)
Admission: AD | Disposition: A | Discharge status: Home / Self Care | Payer: Self-pay | Source: Home / Self Care | Attending: Family Medicine

## 2017-10-21 DIAGNOSIS — O1424 HELLP syndrome, complicating childbirth: Secondary | ICD-10-CM

## 2017-10-21 DIAGNOSIS — Z3A33 33 weeks gestation of pregnancy: Secondary | ICD-10-CM

## 2017-10-21 LAB — CBC
HCT: 29.6 % — ABNORMAL LOW (ref 36.0–46.0)
HCT: 27.3 % — ABNORMAL LOW (ref 36.0–46.0)
HEMOGLOBIN: 9.9 g/dL — AB (ref 12.0–15.0)
Hemoglobin: 10.8 g/dL — ABNORMAL LOW (ref 12.0–15.0)
MCH: 30.7 pg (ref 26.0–34.0)
MCH: 30.7 pg (ref 26.0–34.0)
MCHC: 36.5 g/dL — AB (ref 30.0–36.0)
MCHC: 36.3 g/dL — ABNORMAL HIGH (ref 30.0–36.0)
MCV: 84.1 fL (ref 78.0–100.0)
MCV: 84.5 fL (ref 78.0–100.0)
PLATELETS: 75 10*3/uL — AB (ref 150–400)
Platelets: 65 10*3/uL — ABNORMAL LOW (ref 150–400)
RBC: 3.52 MIL/uL — ABNORMAL LOW (ref 3.87–5.11)
RBC: 3.23 MIL/uL — AB (ref 3.87–5.11)
RDW: 14.4 % (ref 11.5–15.5)
RDW: 14.3 % (ref 11.5–15.5)
WBC: 14.1 10*3/uL — AB (ref 4.0–10.5)
WBC: 16.8 10*3/uL — AB (ref 4.0–10.5)

## 2017-10-21 LAB — COMPREHENSIVE METABOLIC PANEL
ALBUMIN: 2.4 g/dL — AB (ref 3.5–5.0)
ALBUMIN: 2.2 g/dL — AB (ref 3.5–5.0)
ALK PHOS: 130 U/L — AB (ref 38–126)
ALK PHOS: 125 U/L (ref 38–126)
ALT: 228 U/L — AB (ref 14–54)
ALT: 234 U/L — ABNORMAL HIGH (ref 14–54)
ANION GAP: 11 (ref 5–15)
AST: 188 U/L — AB (ref 15–41)
AST: 165 U/L — ABNORMAL HIGH (ref 15–41)
Anion gap: 13 (ref 5–15)
BILIRUBIN TOTAL: 1 mg/dL (ref 0.3–1.2)
BUN: 18 mg/dL (ref 6–20)
BUN: 12 mg/dL (ref 6–20)
CALCIUM: 7.2 mg/dL — AB (ref 8.9–10.3)
CHLORIDE: 104 mmol/L (ref 101–111)
CO2: 17 mmol/L — ABNORMAL LOW (ref 22–32)
CO2: 21 mmol/L — ABNORMAL LOW (ref 22–32)
CREATININE: 1.01 mg/dL — AB (ref 0.44–1.00)
Calcium: 7.6 mg/dL — ABNORMAL LOW (ref 8.9–10.3)
Chloride: 103 mmol/L (ref 101–111)
Creatinine, Ser: 0.9 mg/dL (ref 0.44–1.00)
GFR calc Af Amer: >60 mL/min (ref >60)
GFR calc non Af Amer: >60 mL/min (ref >60)
GLUCOSE: 163 mg/dL — AB (ref 65–99)
Glucose, Bld: 127 mg/dL — ABNORMAL HIGH (ref 65–99)
Potassium: 4.5 mmol/L (ref 3.5–5.1)
Potassium: 4.1 mmol/L (ref 3.5–5.1)
Sodium: 134 mmol/L — ABNORMAL LOW (ref 135–145)
Sodium: 135 mmol/L (ref 135–145)
Total Bilirubin: 1.6 mg/dL — ABNORMAL HIGH (ref 0.3–1.2)
Total Protein: 6.4 g/dL — ABNORMAL LOW (ref 6.5–8.1)
Total Protein: 5.7 g/dL — ABNORMAL LOW (ref 6.5–8.1)

## 2017-10-21 LAB — GLUCOSE, CAPILLARY
GLUCOSE-CAPILLARY: 123 mg/dL — AB (ref 65–99)
Glucose-Capillary: 110 mg/dL — ABNORMAL HIGH (ref 65–99)

## 2017-10-21 LAB — RPR: RPR Ser Ql: NONREACTIVE

## 2017-10-21 SURGERY — Surgical Case
Anesthesia: General

## 2017-10-21 MED ORDER — MAGNESIUM SULFATE 40 G IN LACTATED RINGERS - SIMPLE
2.0000 g/h | INTRAVENOUS | Status: AC
Start: 2017-10-21 — End: 2017-10-22
  Administered 2017-10-21: 2 g/h via INTRAVENOUS
  Filled 2017-10-21: qty 500
  Filled 2017-10-21: qty 40

## 2017-10-21 MED ORDER — COCONUT OIL OIL: 1.0000 "application " | TOPICAL_OIL | Status: DC | PRN

## 2017-10-21 MED ORDER — LIDOCAINE HCL (CARDIAC) PF 100 MG/5ML IV SOSY
PREFILLED_SYRINGE | INTRAVENOUS | Status: DC | PRN
  Administered 2017-10-21: 70 mg via INTRAVENOUS

## 2017-10-21 MED ORDER — SENNOSIDES-DOCUSATE SODIUM 8.6-50 MG PO TABS
2.0000 | ORAL_TABLET | ORAL | Status: DC
  Administered 2017-10-23 (×2): 2 via ORAL
  Filled 2017-10-21 (×4): qty 2

## 2017-10-21 MED ORDER — MISOPROSTOL 200 MCG PO TABS
ORAL_TABLET | ORAL | Status: AC
  Filled 2017-10-21: qty 5

## 2017-10-21 MED ORDER — WITCH HAZEL-GLYCERIN EX PADS: 1.0000 "application " | MEDICATED_PAD | CUTANEOUS | Status: DC | PRN

## 2017-10-21 MED ORDER — ZOLPIDEM TARTRATE 5 MG PO TABS: 5.0000 mg | ORAL_TABLET | Freq: Every evening | ORAL | Status: DC | PRN

## 2017-10-21 MED ORDER — FENTANYL CITRATE (PF) 250 MCG/5ML IJ SOLN
INTRAMUSCULAR | Status: AC
  Filled 2017-10-21: qty 5

## 2017-10-21 MED ORDER — PRENATAL MULTIVITAMIN CH
1.0000 | ORAL_TABLET | Freq: Every day | ORAL | Status: DC
  Administered 2017-10-21 – 2017-10-24 (×4): 1 via ORAL
  Filled 2017-10-21 (×4): qty 1

## 2017-10-21 MED ORDER — OXYCODONE HCL 5 MG PO TABS
10.0000 mg | ORAL_TABLET | ORAL | Status: DC | PRN
  Administered 2017-10-21 – 2017-10-22 (×5): 10 mg via ORAL
  Filled 2017-10-21 (×6): qty 2

## 2017-10-21 MED ORDER — TETANUS-DIPHTH-ACELL PERTUSSIS 5-2.5-18.5 LF-MCG/0.5 IM SUSP: 0.5000 mL | Freq: Once | INTRAMUSCULAR | Status: DC

## 2017-10-21 MED ORDER — SIMETHICONE 80 MG PO CHEW
80.0000 mg | CHEWABLE_TABLET | Freq: Three times a day (TID) | ORAL | Status: DC
  Administered 2017-10-21 – 2017-10-25 (×10): 80 mg via ORAL
  Filled 2017-10-21 (×11): qty 1

## 2017-10-21 MED ORDER — LACTATED RINGERS IV SOLN
INTRAVENOUS | Status: DC
  Administered 2017-10-21 – 2017-10-22 (×3): via INTRAVENOUS

## 2017-10-21 MED ORDER — TRANEXAMIC ACID 1000 MG/10ML IV SOLN
1000.0000 mg | Freq: Once | INTRAVENOUS | Status: DC
  Filled 2017-10-21: qty 10

## 2017-10-21 MED ORDER — DIBUCAINE 1 % RE OINT: 1.0000 "application " | TOPICAL_OINTMENT | RECTAL | Status: DC | PRN

## 2017-10-21 MED ORDER — SIMETHICONE 80 MG PO CHEW: 80.0000 mg | CHEWABLE_TABLET | ORAL | Status: DC | PRN

## 2017-10-21 MED ORDER — DEXAMETHASONE SODIUM PHOSPHATE 4 MG/ML IJ SOLN
INTRAMUSCULAR | Status: DC | PRN
  Administered 2017-10-21: 4 mg via INTRAVENOUS

## 2017-10-21 MED ORDER — FENTANYL CITRATE (PF) 100 MCG/2ML IJ SOLN: 25.0000 ug | INTRAMUSCULAR | Status: DC | PRN

## 2017-10-21 MED ORDER — OXYTOCIN 40 UNITS IN LACTATED RINGERS INFUSION - SIMPLE MED: 2.5000 [IU]/h | INTRAVENOUS | Status: AC

## 2017-10-21 MED ORDER — HYDROMORPHONE HCL 1 MG/ML IJ SOLN
INTRAMUSCULAR | Status: AC
  Filled 2017-10-21: qty 1

## 2017-10-21 MED ORDER — LIDOCAINE HCL (CARDIAC) PF 100 MG/5ML IV SOSY
PREFILLED_SYRINGE | INTRAVENOUS | Status: AC
  Filled 2017-10-21: qty 5

## 2017-10-21 MED ORDER — ONDANSETRON HCL 4 MG/2ML IJ SOLN
INTRAMUSCULAR | Status: AC
  Filled 2017-10-21: qty 2

## 2017-10-21 MED ORDER — FENTANYL CITRATE (PF) 100 MCG/2ML IJ SOLN
INTRAMUSCULAR | Status: DC | PRN
  Administered 2017-10-21: 50 ug via INTRAVENOUS
  Administered 2017-10-21: 100 ug via INTRAVENOUS
  Administered 2017-10-21: 250 ug via INTRAVENOUS

## 2017-10-21 MED ORDER — DEXAMETHASONE SODIUM PHOSPHATE 4 MG/ML IJ SOLN
INTRAMUSCULAR | Status: AC
  Filled 2017-10-21: qty 1

## 2017-10-21 MED ORDER — PROPOFOL 10 MG/ML IV BOLUS
INTRAVENOUS | Status: DC | PRN
  Administered 2017-10-21: 150 mg via INTRAVENOUS

## 2017-10-21 MED ORDER — SUCCINYLCHOLINE CHLORIDE 20 MG/ML IJ SOLN
INTRAMUSCULAR | Status: DC | PRN
  Administered 2017-10-21: 70 mg via INTRAVENOUS

## 2017-10-21 MED ORDER — IBUPROFEN 600 MG PO TABS
600.0000 mg | ORAL_TABLET | Freq: Four times a day (QID) | ORAL | Status: DC
  Administered 2017-10-23 – 2017-10-25 (×9): 600 mg via ORAL
  Filled 2017-10-21 (×12): qty 1

## 2017-10-21 MED ORDER — TRANEXAMIC ACID 1000 MG/10ML IV SOLN
1000.0000 mg | Freq: Once | INTRAVENOUS | Status: AC
  Administered 2017-10-21: 1000 mg via INTRAVENOUS
  Filled 2017-10-21: qty 1100

## 2017-10-21 MED ORDER — FENTANYL CITRATE (PF) 100 MCG/2ML IJ SOLN: INTRAMUSCULAR | Status: DC | PRN

## 2017-10-21 MED ORDER — CEFAZOLIN SODIUM-DEXTROSE 2-3 GM-%(50ML) IV SOLR
INTRAVENOUS | Status: DC | PRN
  Administered 2017-10-21: 2 g via INTRAVENOUS

## 2017-10-21 MED ORDER — AMLODIPINE BESYLATE 10 MG PO TABS
10.0000 mg | ORAL_TABLET | Freq: Every day | ORAL | Status: DC
  Administered 2017-10-21 – 2017-10-23 (×3): 10 mg via ORAL
  Filled 2017-10-21 (×4): qty 1

## 2017-10-21 MED ORDER — MENTHOL 3 MG MT LOZG: 1.0000 | LOZENGE | OROMUCOSAL | Status: DC | PRN

## 2017-10-21 MED ORDER — MIDAZOLAM HCL 2 MG/2ML IJ SOLN
INTRAMUSCULAR | Status: AC
  Filled 2017-10-21: qty 2

## 2017-10-21 MED ORDER — ENOXAPARIN SODIUM 40 MG/0.4ML ~~LOC~~ SOLN
40.0000 mg | SUBCUTANEOUS | Status: DC
  Filled 2017-10-21 (×3): qty 0.4

## 2017-10-21 MED ORDER — ONDANSETRON HCL 4 MG/2ML IJ SOLN
INTRAMUSCULAR | Status: DC | PRN
  Administered 2017-10-21: 4 mg via INTRAVENOUS

## 2017-10-21 MED ORDER — SUCCINYLCHOLINE CHLORIDE 200 MG/10ML IV SOSY
PREFILLED_SYRINGE | INTRAVENOUS | Status: AC
  Filled 2017-10-21: qty 10

## 2017-10-21 MED ORDER — OXYTOCIN 10 UNIT/ML IJ SOLN
INTRAVENOUS | Status: DC | PRN
  Administered 2017-10-21: 40 [IU] via INTRAVENOUS

## 2017-10-21 MED ORDER — TRANEXAMIC ACID 1000 MG/10ML IV SOLN: 1000.0000 mg | Freq: Once | INTRAVENOUS | Status: DC

## 2017-10-21 MED ORDER — MIDAZOLAM HCL 2 MG/2ML IJ SOLN
INTRAMUSCULAR | Status: DC | PRN
  Administered 2017-10-21: 2 mg via INTRAVENOUS

## 2017-10-21 MED ORDER — HYDROMORPHONE HCL 1 MG/ML IJ SOLN
INTRAMUSCULAR | Status: DC | PRN
  Administered 2017-10-21: 1 mg via INTRAVENOUS

## 2017-10-21 MED ORDER — SCOPOLAMINE 1 MG/3DAYS TD PT72
MEDICATED_PATCH | TRANSDERMAL | Status: DC | PRN
  Administered 2017-10-21: 1 via TRANSDERMAL

## 2017-10-21 MED ORDER — DIPHENHYDRAMINE HCL 25 MG PO CAPS: 25.0000 mg | ORAL_CAPSULE | Freq: Four times a day (QID) | ORAL | Status: DC | PRN

## 2017-10-21 MED ORDER — LACTATED RINGERS IV SOLN
INTRAVENOUS | Status: DC | PRN
  Administered 2017-10-21: 02:00:00 via INTRAVENOUS

## 2017-10-21 MED ORDER — PROMETHAZINE HCL 25 MG/ML IJ SOLN: 6.2500 mg | INTRAMUSCULAR | Status: DC | PRN

## 2017-10-21 MED ORDER — PROPOFOL 10 MG/ML IV BOLUS
INTRAVENOUS | Status: AC
  Filled 2017-10-21: qty 20

## 2017-10-21 MED ORDER — ACETAMINOPHEN 325 MG PO TABS
650.0000 mg | ORAL_TABLET | ORAL | Status: DC | PRN
  Filled 2017-10-21: qty 2

## 2017-10-21 MED ORDER — SIMETHICONE 80 MG PO CHEW
80.0000 mg | CHEWABLE_TABLET | ORAL | Status: DC
  Administered 2017-10-21 – 2017-10-25 (×4): 80 mg via ORAL
  Filled 2017-10-21 (×4): qty 1

## 2017-10-21 MED ORDER — OXYCODONE HCL 5 MG PO TABS
5.0000 mg | ORAL_TABLET | ORAL | Status: DC | PRN
  Administered 2017-10-22: 5 mg via ORAL
  Filled 2017-10-21: qty 1

## 2017-10-21 SURGICAL SUPPLY — 31 items
BENZOIN TINCTURE PRP APPL 2/3 (GAUZE/BANDAGES/DRESSINGS) ×3 IMPLANT
CHLORAPREP W/TINT 26ML (MISCELLANEOUS) ×3 IMPLANT
CLAMP CORD UMBIL (MISCELLANEOUS) IMPLANT
CLOSURE WOUND 1/2 X4 (GAUZE/BANDAGES/DRESSINGS) ×1
CLOTH BEACON ORANGE TIMEOUT ST (SAFETY) ×3 IMPLANT
DRSG OPSITE POSTOP 4X10 (GAUZE/BANDAGES/DRESSINGS) ×3 IMPLANT
ELECT REM PT RETURN 9FT ADLT (ELECTROSURGICAL) ×3
ELECTRODE REM PT RTRN 9FT ADLT (ELECTROSURGICAL) ×1 IMPLANT
EXTRACTOR VACUUM M CUP 4 TUBE (SUCTIONS) IMPLANT
EXTRACTOR VACUUM M CUP 4' TUBE (SUCTIONS)
GLOVE BIOGEL PI IND STRL 7.0 (GLOVE) ×2 IMPLANT
GLOVE BIOGEL PI IND STRL 7.5 (GLOVE) ×2 IMPLANT
GLOVE BIOGEL PI INDICATOR 7.0 (GLOVE) ×4
GLOVE BIOGEL PI INDICATOR 7.5 (GLOVE) ×4
GLOVE ECLIPSE 7.5 STRL STRAW (GLOVE) ×3 IMPLANT
GOWN STRL REUS W/TWL LRG LVL3 (GOWN DISPOSABLE) ×9 IMPLANT
KIT ABG SYR 3ML LUER SLIP (SYRINGE) IMPLANT
NEEDLE HYPO 25X5/8 SAFETYGLIDE (NEEDLE) IMPLANT
NS IRRIG 1000ML POUR BTL (IV SOLUTION) ×3 IMPLANT
PACK C SECTION WH (CUSTOM PROCEDURE TRAY) ×3 IMPLANT
PAD OB MATERNITY 4.3X12.25 (PERSONAL CARE ITEMS) ×3 IMPLANT
PENCIL SMOKE EVAC W/HOLSTER (ELECTROSURGICAL) ×3 IMPLANT
RTRCTR C-SECT PINK 25CM LRG (MISCELLANEOUS) ×3 IMPLANT
STRIP CLOSURE SKIN 1/2X4 (GAUZE/BANDAGES/DRESSINGS) ×2 IMPLANT
SUT VIC AB 0 CTX 36 (SUTURE) ×6
SUT VIC AB 0 CTX36XBRD ANBCTRL (SUTURE) ×3 IMPLANT
SUT VIC AB 2-0 CT1 27 (SUTURE) ×2
SUT VIC AB 2-0 CT1 TAPERPNT 27 (SUTURE) ×1 IMPLANT
SUT VIC AB 4-0 KS 27 (SUTURE) ×3 IMPLANT
TOWEL OR 17X24 6PK STRL BLUE (TOWEL DISPOSABLE) ×3 IMPLANT
TRAY FOLEY W/BAG SLVR 14FR LF (SET/KITS/TRAYS/PACK) ×3 IMPLANT

## 2017-10-21 NOTE — Anesthesia Procedure Notes (Signed)
Procedure Name: Intubation Date/Time: 10/21/2017 1:56 AM Performed by: Elbert Ewingshymer, Sinthia Karabin S, CRNA Pre-anesthesia Checklist: Patient identified, Emergency Drugs available, Suction available, Patient being monitored and Timeout performed Patient Re-evaluated:Patient Re-evaluated prior to induction Oxygen Delivery Method: Circle system utilized Preoxygenation: Pre-oxygenation with 100% oxygen Induction Type: IV induction, Cricoid Pressure applied and Rapid sequence Laryngoscope Size: Glidescope and 3 (Lopro 3) Grade View: Grade I Tube type: Oral Tube size: 7.0 mm Number of attempts: 1 Airway Equipment and Method: Stylet and Video-laryngoscopy Placement Confirmation: ETT inserted through vocal cords under direct vision,  positive ETCO2 and breath sounds checked- equal and bilateral Secured at: 21 cm Tube secured with: Tape Dental Injury: Teeth and Oropharynx as per pre-operative assessment

## 2017-10-21 NOTE — Transfer of Care (Signed)
Immediate Anesthesia Transfer of Care Note  Patient: Amanda Meza  Procedure(s) Performed: CESAREAN SECTION (N/A )  Patient Location: PACU  Anesthesia Type:General  Level of Consciousness: awake, alert  and oriented  Airway & Oxygen Therapy: Patient Spontanous Breathing and Patient connected to nasal cannula oxygen  Post-op Assessment: Report given to RN and Post -op Vital signs reviewed and stable  Post vital signs: Reviewed and stable HR 78, RR 16, SaO2 100%, BP 140/94  Last Vitals:  Vitals Value Taken Time  BP    Temp    Pulse 78 10/21/2017  3:23 AM  Resp 12 10/21/2017  3:23 AM  SpO2 100 % 10/21/2017  3:23 AM  Vitals shown include unvalidated device data.  Last Pain:  Vitals:   10/20/17 2001  TempSrc: Oral  PainSc:       Patients Stated Pain Goal: 3 (10/20/17 1610)  Complications: No apparent anesthesia complications

## 2017-10-21 NOTE — Op Note (Signed)
Cesarean Section Operative Report  PATIENT: Amanda Meza  PROCEDURE DATE: 10/21/2017  PREOPERATIVE DIAGNOSES: Intrauterine pregnancy at [redacted]w[redacted]d weeks gestation; NRFHT; HELLP syndrome  POSTOPERATIVE DIAGNOSES: The same  PROCEDURE: Primary Low Transverse Cesarean Section  SURGEON:   Surgeon(s) and Role:    * Levie Heritage, DO - Primary    * Najmah Carradine, Kandra Nicolas, MD - OB Fellow   INDICATIONS: Amanda Meza is a 27 y.o. G1P0 at [redacted]w[redacted]d here for cesarean section secondary to the indications listed under preoperative diagnoses; please see preoperative note for further details.  The risks of cesarean section were discussed with the patient including but were not limited to: bleeding which may require transfusion or reoperation; infection which may require antibiotics; injury to bowel, bladder, ureters or other surrounding organs; injury to the fetus; need for additional procedures including hysterectomy in the event of a life-threatening hemorrhage; placental abnormalities wth subsequent pregnancies, incisional problems, thromboembolic phenomenon and other postoperative/anesthesia complications.   The patient concurred with the proposed plan, giving informed written consent for the procedure.    FINDINGS:  Viable female infant in cephalic presentation with loose nuchal cord.  Apgars 2, 6, and 8. Arterial cord pH 7.218.  Clear amniotic fluid.  Intact placenta, three vessel cord.  Normal uterus, fallopian tubes and ovaries bilaterally.  ANESTHESIA: General INTRAVENOUS FLUIDS: 1450 mL ESTIMATED BLOOD LOSS: 476 mL URINE OUTPUT:  500 ml SPECIMENS: Placenta sent to pathology COMPLICATIONS: None immediate  PROCEDURE IN DETAIL:  The patient preoperatively received intravenous antibiotics and had sequential compression devices applied to her lower extremities.  She was then taken to the operating room where she was then placed in a dorsal supine position with a leftward tilt, and prepped and  draped in a sterile manner.  A foley catheter was placed into her bladder and attached to constant gravity. Tranexamic acid was also administered IV prophylactically d/t thrombocytopenia. After an adequate timeout was performed, general anesthesia was done d/t risk of neuraxial anesthesia with thrombocytopenia (platelets 63). A Pfannenstiel skin incision was made with scalpel and carried through to the underlying layer of fascia. The fascia was incised in the midline, and this incision was extended bilaterally bluntly, and the fascia dissected off the underlying rectus muscles bluntly. The rectus muscles were separated in the midline bluntly and the peritoneum was entered bluntly. Attention was turned to the lower uterine segment where a low transverse hysterotomy was made with a scalpel and extended bilaterally bluntly.  The infant was successfully delivered, the cord was clamped and cut, and the infant was handed over to the awaiting neonatology team. Uterine massage was then administered, and the placenta delivered intact with a three-vessel cord. The uterus was then cleared of clots and debris.  The hysterotomy was closed with 0 Vicryl in a running locked fashion, and an imbricating layer was also placed with 0 Vicryl.  The pelvis was cleared of all clot and debris. Hemostasis was confirmed on all surfaces.  The peritoneum was closed with a 0 Vicryl running stitch. The fascia was then closed using 0 Vicryl in a running fashion.  The subcutaneous layer was irrigated, and he skin was closed with a 4-0 Vicryl subcuticular stitch.   The patient tolerated the procedure well. Sponge, lap, instrument and needle counts were correct x 3.  She was taken to the recovery room in stable condition.   Maternal Disposition: PACU - hemodynamically stable.   Infant Disposition: NICU d/t prematurity    Signed: Frederik Pear, MD OB  Fellow 10/21/2017 3:18 AM

## 2017-10-21 NOTE — Anesthesia Postprocedure Evaluation (Signed)
Anesthesia Post Note  Patient: Amanda Meza  Procedure(s) Performed: CESAREAN SECTION (N/A )     Patient location during evaluation: Women's Unit Anesthesia Type: General Level of consciousness: awake and alert Pain management: pain level controlled Vital Signs Assessment: post-procedure vital signs reviewed and stable Respiratory status: spontaneous breathing, nonlabored ventilation, respiratory function stable and patient connected to nasal cannula oxygen Cardiovascular status: blood pressure returned to baseline and stable Postop Assessment: no apparent nausea or vomiting Anesthetic complications: no    Last Vitals:  Vitals:   10/21/17 0645 10/21/17 0729  BP: 133/88 (!) 142/93  Pulse: 79 80  Resp: 17 18  Temp: 36.5 C 36.7 C  SpO2: 99% 100%    Last Pain:  Vitals:   10/21/17 0730  TempSrc:   PainSc: 3    Pain Goal: Patients Stated Pain Goal: 3 (10/21/17 0730)               Junious SilkGILBERT,[name]

## 2017-10-21 NOTE — Anesthesia Preprocedure Evaluation (Addendum)
Anesthesia Evaluation  Patient identified by MRN, date of birth, ID band Patient awake    Reviewed: Allergy & Precautions, NPO status , Patient's Chart, lab work & pertinent test results  History of Anesthesia Complications Negative for: history of anesthetic complications  Airway Mallampati: II  TM Distance: >3 FB Neck ROM: Full    Dental no notable dental hx. (+) Dental Advisory Given   Pulmonary neg pulmonary ROS,    Pulmonary exam normal        Cardiovascular hypertension, Normal cardiovascular exam     Neuro/Psych negative neurological ROS  negative psych ROS   GI/Hepatic negative GI ROS, Neg liver ROS,   Endo/Other  diabetes  Renal/GU negative Renal ROS  negative genitourinary   Musculoskeletal negative musculoskeletal ROS (+)   Abdominal   Peds negative pediatric ROS (+)  Hematology negative hematology ROS (+)   Anesthesia Other Findings   Reproductive/Obstetrics (+) Pregnancy HELLP                            Anesthesia Physical Anesthesia Plan  ASA: III and emergent  Anesthesia Plan: General   Post-op Pain Management:    Induction: Intravenous, Rapid sequence and Cricoid pressure planned  PONV Risk Score and Plan: 4 or greater and Ondansetron, Dexamethasone, Scopolamine patch - Pre-op and Treatment may vary due to age or medical condition  Airway Management Planned: Oral ETT  Additional Equipment:   Intra-op Plan:   Post-operative Plan: Extubation in OR  Informed Consent: I have reviewed the patients History and Physical, chart, labs and discussed the procedure including the risks, benefits and alternatives for the proposed anesthesia with the patient or authorized representative who has indicated his/her understanding and acceptance.   Dental advisory given  Plan Discussed with: Anesthesiologist and CRNA  Anesthesia Plan Comments:        Anesthesia Quick  Evaluation

## 2017-10-21 NOTE — Progress Notes (Signed)
Patient ID: Amanda Meza, Amanda Meza   DOB: 03-05-1991, 27 y.o.   MRN: [id number]  Patient seen for persistent category 2 strip. Discussed that at this stage, safer to do cesarean delivery.   The risks of cesarean section discussed with the patient included but were not limited to: bleeding which may require transfusion or reoperation; infection which may require antibiotics; injury to bowel, bladder, ureters or other surrounding organs; injury to the fetus; need for additional procedures including hysterectomy in the event of a life-threatening hemorrhage; placental abnormalities wth subsequent pregnancies, incisional problems, thromboembolic phenomenon and other postoperative/anesthesia complications. The patient concurred with the proposed plan, giving informed written consent for the procedure.   Patient has been NPO for approximately 8 hours. She will remain NPO for procedure. Anesthesia and OR aware.  Preoperative prophylactic Ancef ordered on call to the OR.  To OR when ready.  Will give TXA now, given low platelets.  Levie HeritageStinson, Amanda Preslar J, DO 10/21/2017 1:33 AM

## 2017-10-21 NOTE — Addendum Note (Signed)
Addendum  created 10/21/17 0813 by Junious SilkGilbert, Jospeh Mangel, CRNA   Sign clinical note

## 2017-10-21 NOTE — Anesthesia Postprocedure Evaluation (Signed)
Anesthesia Post Note  Patient: Amanda Meza  Procedure(s) Performed: CESAREAN SECTION (N/A )     Patient location during evaluation: PACU Anesthesia Type: General Level of consciousness: sedated Pain management: pain level controlled Vital Signs Assessment: post-procedure vital signs reviewed and stable Respiratory status: spontaneous breathing and respiratory function stable Cardiovascular status: stable Postop Assessment: no apparent nausea or vomiting Anesthetic complications: no    Last Vitals:  Vitals:   10/21/17 0438 10/21/17 0553  BP: (!) 139/96 130/88  Pulse: 73 70  Resp: 15 16  Temp: 36.6 C 36.6 C  SpO2: 100% 99%    Last Pain:  Vitals:   10/21/17 0553  TempSrc: Oral  PainSc:    Pain Goal: Patients Stated Pain Goal: 3 (10/20/17 1610)               Elexius Minar DANIEL

## 2017-10-21 NOTE — Progress Notes (Signed)
Pt set up with DEEP and instructed on use. Infant in the NICU. Pt declined to start pumping at this time due to visitors arriving. Instructed the patient to hit call bell when ready for assistance. Carmelina DaneERRI L Merland Holness, RN

## 2017-10-22 LAB — CBC
HCT: 30.7 % — ABNORMAL LOW (ref 36.0–46.0)
HEMATOCRIT: 28.4 % — AB (ref 36.0–46.0)
HEMOGLOBIN: 10.3 g/dL — AB (ref 12.0–15.0)
Hemoglobin: 10.9 g/dL — ABNORMAL LOW (ref 12.0–15.0)
MCH: 30.7 pg (ref 26.0–34.0)
MCH: 30.4 pg (ref 26.0–34.0)
MCHC: 36.3 g/dL — ABNORMAL HIGH (ref 30.0–36.0)
MCHC: 35.5 g/dL (ref 30.0–36.0)
MCV: 84.5 fL (ref 78.0–100.0)
MCV: 85.8 fL (ref 78.0–100.0)
PLATELETS: 83 10*3/uL — AB (ref 150–400)
PLATELETS: 105 10*3/uL — AB (ref 150–400)
RBC: 3.36 MIL/uL — AB (ref 3.87–5.11)
RBC: 3.58 MIL/uL — ABNORMAL LOW (ref 3.87–5.11)
RDW: 14.5 % (ref 11.5–15.5)
RDW: 14.4 % (ref 11.5–15.5)
WBC: 16.2 10*3/uL — AB (ref 4.0–10.5)
WBC: 17.8 10*3/uL — AB (ref 4.0–10.5)

## 2017-10-22 LAB — CULTURE, BETA STREP (GROUP B ONLY)

## 2017-10-22 LAB — COMPREHENSIVE METABOLIC PANEL
ALK PHOS: 126 U/L (ref 38–126)
ALT: 244 U/L — AB (ref 14–54)
ALT: 285 U/L — ABNORMAL HIGH (ref 14–54)
AST: 150 U/L — AB (ref 15–41)
AST: 178 U/L — AB (ref 15–41)
Albumin: 2.4 g/dL — ABNORMAL LOW (ref 3.5–5.0)
Albumin: 2.5 g/dL — ABNORMAL LOW (ref 3.5–5.0)
Alkaline Phosphatase: 140 U/L — ABNORMAL HIGH (ref 38–126)
Anion gap: 10 (ref 5–15)
Anion gap: 10 (ref 5–15)
BILIRUBIN TOTAL: 1 mg/dL (ref 0.3–1.2)
BUN: 9 mg/dL (ref 6–20)
BUN: 7 mg/dL (ref 6–20)
CALCIUM: 7.1 mg/dL — AB (ref 8.9–10.3)
CHLORIDE: 105 mmol/L (ref 101–111)
CHLORIDE: 103 mmol/L (ref 101–111)
CO2: 24 mmol/L (ref 22–32)
CO2: 26 mmol/L (ref 22–32)
CREATININE: 0.69 mg/dL (ref 0.44–1.00)
Calcium: 7.5 mg/dL — ABNORMAL LOW (ref 8.9–10.3)
Creatinine, Ser: 0.75 mg/dL (ref 0.44–1.00)
GFR calc Af Amer: >60 mL/min (ref >60)
GFR calc Af Amer: >60 mL/min (ref >60)
Glucose, Bld: 96 mg/dL (ref 65–99)
Glucose, Bld: 80 mg/dL (ref 65–99)
POTASSIUM: 4 mmol/L (ref 3.5–5.1)
Potassium: 3.6 mmol/L (ref 3.5–5.1)
Sodium: 139 mmol/L (ref 135–145)
Sodium: 139 mmol/L (ref 135–145)
Total Bilirubin: 0.6 mg/dL (ref 0.3–1.2)
Total Protein: 6.2 g/dL — ABNORMAL LOW (ref 6.5–8.1)
Total Protein: 6.2 g/dL — ABNORMAL LOW (ref 6.5–8.1)

## 2017-10-22 LAB — GLUCOSE, CAPILLARY: GLUCOSE-CAPILLARY: 84 mg/dL (ref 65–99)

## 2017-10-22 MED ORDER — HYDRALAZINE HCL 10 MG PO TABS
10.0000 mg | ORAL_TABLET | Freq: Four times a day (QID) | ORAL | Status: DC
  Administered 2017-10-22 – 2017-10-25 (×10): 10 mg via ORAL
  Filled 2017-10-22 (×13): qty 1

## 2017-10-22 MED ORDER — LABETALOL HCL 200 MG PO TABS
400.0000 mg | ORAL_TABLET | Freq: Two times a day (BID) | ORAL | Status: DC
  Administered 2017-10-22: 400 mg via ORAL
  Filled 2017-10-22: qty 2

## 2017-10-22 NOTE — Lactation Note (Signed)
This note was copied from a baby's chart. Lactation Consultation Note  Patient Name: Girl Emi HolesQuanesha Derenzo ZOXWR'UToday's Date: 10/22/2017 Reason for consult: Initial assessment;NICU baby;Preterm <34wks;Other (Comment)(B/P issues and post MagSo4 , )  Baby is 7340 hours old ,  Baby is 8733 1/[redacted] weeks gestation - 3.0.3 oz birth weight  As LC entered the room, mom resting in bed due to elevated B/P.  Per mom has pumped x 2 in the last 24 hours and # 24 Flange - comfortable.  LC reviewed supply and demand, and the importance of pumping both breast  8-10 times in 24 hours, along with hand expressing before and after pumping to  Increased EBM yield.  LC showed mom how to hand express , with tiny drops, mom returned demo. Mother informed of post-discharge support and given phone number to the lactation department, including services for phone call assistance; out-patient appointments; and breastfeeding support group. List of other breastfeeding resources in the community given in the handout. Encouraged mother to call for problems or concerns related to breastfeeding.  [facility] [place]  [facility] and [facility].  LC asked moms permission to send a [facility], mom gave consent, and LC faxed.      Maternal Data Has patient been taught Hand Expression?: Yes(LC taught mom , small drops noted , and mom returned demo ) Does the patient have breastfeeding experience prior to this delivery?: No  Feeding    LATCH Score                   Interventions Interventions: Breast feeding basics reviewed;DEBP  Lactation Tools Discussed/Used Tools: Pump Breast pump type: Double-Electric Breast Pump WIC Program: No(plans to sign up and LC recommended to call - # given, and referral sent ) Pump Review: Setup, frequency, and cleaning;Milk Storage Initiated by:: LC reviewed / MAI    Consult Status Consult Status:  Follow-up Date: 10/23/17 Follow-up type: In-patient    Matilde SprangMargaret Ann Cleofas Hudgins 10/22/2017, 7:45 PM

## 2017-10-22 NOTE — Progress Notes (Signed)
Dr. Debroah LoopArnold notified of BP 161/98. No new orders given. Carmelina DaneERRI L Cambrie Sonnenfeld, RN

## 2017-10-22 NOTE — Progress Notes (Signed)
Subjective: Postpartum Day 2: Cesarean Delivery Patient reports incisional pain, tolerating PO and no problems voiding.    Objective: Vital signs in last 24 hours: Temp:  [97.7 F (36.5 C)-98.9 F (37.2 C)] 98.9 F (37.2 C) (06/06 0804) Pulse Rate:  [73-98] 98 (06/06 0804) Resp:  [16-20] 18 (06/06 0804) BP: (136-166)/(83-110) 164/103 (06/06 0804) SpO2:  [99 %-100 %] 100 % (06/06 0804)  Physical Exam:  General: alert, cooperative and no distress Lochia: appropriate Uterine Fundus: firm Incision: dressing dry DVT Evaluation: No evidence of DVT seen on physical exam.  Recent Labs    10/21/17 1805 10/22/17 0617  HGB 9.9* 10.3*  HCT 27.3* 28.4*    Assessment/Plan: Status post Cesarean section. Postoperative course complicated by preeclampsia  Add apresoline for BP control.  Scheryl DarterJames Gen Clagg 10/22/2017, 11:08 AM

## 2017-10-23 MED ORDER — LABETALOL HCL 300 MG PO TABS
600.0000 mg | ORAL_TABLET | Freq: Three times a day (TID) | ORAL | Status: DC
  Administered 2017-10-23 – 2017-10-24 (×2): 600 mg via ORAL
  Filled 2017-10-23 (×3): qty 2

## 2017-10-23 MED ORDER — LABETALOL HCL 200 MG PO TABS
400.0000 mg | ORAL_TABLET | Freq: Three times a day (TID) | ORAL | Status: DC
  Administered 2017-10-23 (×2): 400 mg via ORAL
  Filled 2017-10-23 (×2): qty 2

## 2017-10-23 NOTE — Lactation Note (Signed)
This note was copied from a baby's chart. Lactation Consultation Note  Patient Name: Amanda Meza NWGNF'AToday's Date: 10/23/2017    RN Request for [facility] Follow Up:  Mother is engorged bilaterally.  She has a DEBP at bedside but stated she has only pumped twice today.    Mother's breasts are firm and sore.  I suggested she continue doing warm compresses prior to pumping along with breast massage and hand expression.  Mother states that the warm compresses helped her earlier today.  I reminded her to pump 8-12 times/24 hours.  She will use ice packs for 15-20 minutes at a time beginning now and continue this as needed for engorgement treatment.  Mother verbalized understanding of plan.  She is going to collect and take any EBM she obtains to the NICU.  She will call for assistance as needed.          Consult Status      Dora SimsBeth R Tj Kitchings 10/23/2017, 10:23 PM

## 2017-10-23 NOTE — Progress Notes (Signed)
Both breast engorged.  Ice pack applied.  Patient stated decreased in pain after ice pack applied.

## 2017-10-23 NOTE — Progress Notes (Signed)
Subjective: Postpartum Day 2: Cesarean Delivery Patient reports incisional pain, tolerating PO and + flatus.    Objective: Vital signs in last 24 hours: Temp:  [97.9 F (36.6 C)-100.4 F (38 C)] 98.7 F (37.1 C) (06/07 0758) Pulse Rate:  [68-91] 72 (06/07 0840) Resp:  [16-18] 16 (06/07 0758) BP: (121-177)/(71-110) 166/108 (06/07 0840) SpO2:  [91 %-100 %] 100 % (06/07 0758)  Physical Exam:  General: alert, cooperative and no distress Lochia: appropriate Uterine Fundus: firm Incision: healing well DVT Evaluation: No evidence of DVT seen on physical exam.  Recent Labs    10/22/17 0617 10/22/17 1757  HGB 10.3* 10.9*  HCT 28.4* 30.7*    Assessment/Plan: Status post Cesarean section. hypertension postpartum  Increase po labetalol to TID.  Scheryl DarterJames Arnold 10/23/2017, 9:03 AM

## 2017-10-23 NOTE — Lactation Note (Signed)
This note was copied from a baby's chart. Lactation Consultation Note  Patient Name: Amanda Meza Reason for consult: Follow-up assessment;NICU baby;Preterm <34wks;Other (Comment)(pe rmom has only pumped x 1 since LC saw mom last night , WIC has provided a debp for home )  Baby is 3261 hours old  As LC entered moms room - she mentioned she had pumped about and hour and 1/2 ago and requested to pump  Again with LC to know she was doing it right.  LC reviewed basics - supply and demand and the importance of pumping both breast 8-10 x's a day.  And hand expressing. LC showed  Mom how to set up the pump, hold her pump pieces. Mom mentioned the sides  Of her breast were fuller and felt knotty.  LC assessed with moms permission and noted both breast to have swollen nodules .  LC had mom pump both breast / #24 F good fit and per mom comfortable. Warm moist wash clothes applied to the nodules and it help to release. Per mom felt better.  With 1 ml EBM yield on the left breast/ drops from the right . Left breast nodules were gone after pumping,  Right majority were gone , 1-3 upper aspect still present.  LC recommended prior to the next pumping to massage both breast, hand express, and pump, may need to use warm wash clothes.  LC reviewed sore nipple and engorgement prevention and tx.  [facility]  Mother informed of post-discharge support and given phone number to the lactation department, including services for phone call assistance; out-patient appointments; and breastfeeding support group. List of other breastfeeding resources in the community given in the handout. Encouraged mother to call for problems or concerns related to breastfeeding.  LC praised mom for th e 1ml of EBM and reassured her if she increases her pumping to at least 8 times she will get more milk for her baby.     Maternal Data Has patient been taught Hand Expression?:  Yes(LC reviewed / breast are fuller withfilled nodules outer aspects of both breast )  Feeding Feeding Type: Donor Breast Milk  LATCH Score                   Interventions Interventions: Breast feeding basics reviewed;DEBP  Lactation Tools Discussed/Used Tools: Pump;Flanges Flange Size: 24(good fit and perm om comfortable ) Breast pump type: Double-Electric Breast Pump WIC Program: Yes(mom is signed up today and has her DEBP ) Pump Review: Setup, frequency, and cleaning(LC reviewed and assisted )   Consult Status Consult Status: Follow-up Date: 10/24/17 Follow-up type: In-patient    Amanda Meza Meza, 3:29 PM

## 2017-10-24 LAB — COMPREHENSIVE METABOLIC PANEL WITH GFR
ALT: 191 U/L — ABNORMAL HIGH (ref 14–54)
AST: 88 U/L — ABNORMAL HIGH (ref 15–41)
Albumin: 2.2 g/dL — ABNORMAL LOW (ref 3.5–5.0)
Alkaline Phosphatase: 113 U/L (ref 38–126)
Anion gap: 9 (ref 5–15)
BUN: 10 mg/dL (ref 6–20)
CO2: 25 mmol/L (ref 22–32)
Calcium: 7.7 mg/dL — ABNORMAL LOW (ref 8.9–10.3)
Chloride: 105 mmol/L (ref 101–111)
Creatinine, Ser: 0.89 mg/dL (ref 0.44–1.00)
GFR calc Af Amer: >60 mL/min
GFR calc non Af Amer: >60 mL/min
Glucose, Bld: 86 mg/dL (ref 65–99)
Potassium: 3.7 mmol/L (ref 3.5–5.1)
Sodium: 139 mmol/L (ref 135–145)
Total Bilirubin: 0.4 mg/dL (ref 0.3–1.2)
Total Protein: 5.8 g/dL — ABNORMAL LOW (ref 6.5–8.1)

## 2017-10-24 LAB — CBC
HEMATOCRIT: 25.8 % — AB (ref 36.0–46.0)
Hemoglobin: 9 g/dL — ABNORMAL LOW (ref 12.0–15.0)
MCH: 30.6 pg (ref 26.0–34.0)
MCHC: 34.9 g/dL (ref 30.0–36.0)
MCV: 87.8 fL (ref 78.0–100.0)
Platelets: 132 10*3/uL — ABNORMAL LOW (ref 150–400)
RBC: 2.94 MIL/uL — ABNORMAL LOW (ref 3.87–5.11)
RDW: 14.8 % (ref 11.5–15.5)
WBC: 11.8 10*3/uL — ABNORMAL HIGH (ref 4.0–10.5)

## 2017-10-24 MED ORDER — LABETALOL HCL 200 MG PO TABS
800.0000 mg | ORAL_TABLET | Freq: Three times a day (TID) | ORAL | Status: DC
  Administered 2017-10-24 – 2017-10-25 (×3): 800 mg via ORAL
  Filled 2017-10-24 (×8): qty 4

## 2017-10-24 MED ORDER — CLONIDINE HCL 0.2 MG/24HR TD PTWK
0.2000 mg | MEDICATED_PATCH | TRANSDERMAL | Status: DC
Start: 2017-10-24 — End: 2017-10-25
  Administered 2017-10-24: 0.2 mg via TRANSDERMAL
  Filled 2017-10-24: qty 1

## 2017-10-24 NOTE — Lactation Note (Signed)
This note was copied from a baby's chart. Lactation Consultation Note  Patient Name: Girl Emi HolesQuanesha Dillingham ZOXWR'UToday's Date: 10/24/2017 Reason for consult: Follow-up assessment;Other (Comment);Preterm <34wks(borderline engorged lateral aspects of breast - enc to pump both breast )  Baby is 8881 hours old  Per mom has pumped  x 4 since this LC worked with her yesterday.  The most she has gotten is 30 ml.  LC praised mom for her efforts pumping and encouraged mom to continue to work on being  Consistent with pumping and hand expressing.  LC also mentioned to mom if she wakes up and her breast are greater than full to and engorged to ask For ice packs to ice for 15 -2o misn and then pump to relieve engorgement.  [facility]    Maternal Data    Feeding Feeding Type: Donor Breast Milk  LATCH Score                   Interventions Interventions: Breast feeding basics reviewed;DEBP  Lactation Tools Discussed/Used Pump Review: Setup, frequency, and cleaning;Milk Storage(enc mom to pump and if breast are full at 2 hours release down and definitely by 3 )   Consult Status Consult Status: Follow-up Date: 10/25/17 Follow-up type: In-patient    Matilde SprangMargaret Ann Shareef Eddinger 10/24/2017, 11:30 AM

## 2017-10-24 NOTE — Progress Notes (Signed)
   10/24/17 0859  Vital Signs  BP (!) 165/112  Dr. Despina HiddenEure on unit and aware of BP... Orders received.

## 2017-10-24 NOTE — Progress Notes (Signed)
Subjective: Postpartum Day 3: Cesarean Delivery Patient reports incisional pain, tolerating PO, + flatus, + BM and no problems voiding.    Objective: Vital signs in last 24 hours: Temp:  [97.8 F (36.6 C)-98.9 F (37.2 C)] 98.1 F (36.7 C) (06/08 0753) Pulse Rate:  [70-86] 72 (06/08 0859) Resp:  [17-18] 18 (06/08 0753) BP: (129-188)/(84-112) 165/112 (06/08 0859) SpO2:  [100 %] 100 % (06/08 0753)   Vitals:   10/24/17 0400 10/24/17 0753 10/24/17 0856 10/24/17 0859  BP: (!) 142/89 (!) 158/100 (!) 188/110 (!) 165/112  Pulse: 70 77 70 72  Resp: 18 18    Temp: 98.4 F (36.9 C) 98.1 F (36.7 C)    TempSrc: Oral Oral    SpO2: 100% 100%    Weight:      Height:        Physical Exam:  General: alert, cooperative and no distress Lochia: appropriate Uterine Fundus: firm Incision: healing well DVT Evaluation: No evidence of DVT seen on physical exam.  Recent Labs    10/22/17 1757 10/24/17 0643  HGB 10.9* 9.0*  HCT 30.7* 25.8*    Assessment/Plan: Status post Cesarean section. CHTN with superimposed severe pre eclampsia, sub optimal BP control postpartum: increase labetalol to 800 TID, continue apresoline 10 mg q6 and add clonidine patch 0.2 mg(7 day patch) Re evaluate later today for possible discharge, may require discharge tomorrow, depending on BP response.  Lazaro ArmsLuther H Billey Wojciak 10/24/2017, 9:21 AM

## 2017-10-25 ENCOUNTER — Ambulatory Visit: Payer: Self-pay

## 2017-10-25 MED ORDER — NIFEDIPINE 10 MG PO CAPS
20.0000 mg | ORAL_CAPSULE | Freq: Four times a day (QID) | ORAL | Status: DC
  Administered 2017-10-25: 20 mg via ORAL
  Filled 2017-10-25: qty 2

## 2017-10-25 MED ORDER — OXYCODONE HCL 5 MG PO TABS: 5.0000 mg | ORAL_TABLET | Freq: Four times a day (QID) | ORAL | 0 refills | Status: DC | PRN

## 2017-10-25 MED ORDER — LABETALOL HCL 5 MG/ML IV SOLN
INTRAVENOUS | Status: AC
  Filled 2017-10-25: qty 4

## 2017-10-25 MED ORDER — HYDRALAZINE HCL 20 MG/ML IJ SOLN
10.0000 mg | Freq: Once | INTRAMUSCULAR | Status: AC | PRN
  Administered 2017-10-25: 10 mg via INTRAVENOUS
  Filled 2017-10-25: qty 1

## 2017-10-25 MED ORDER — HYDRALAZINE HCL 10 MG PO TABS
10.0000 mg | ORAL_TABLET | Freq: Four times a day (QID) | ORAL | Status: DC
  Administered 2017-10-25: 10 mg via ORAL
  Filled 2017-10-25 (×6): qty 1

## 2017-10-25 MED ORDER — HYDROXYZINE HCL 25 MG PO TABS
25.0000 mg | ORAL_TABLET | Freq: Three times a day (TID) | ORAL | Status: DC | PRN
  Administered 2017-10-25: 25 mg via ORAL
  Filled 2017-10-25 (×2): qty 1

## 2017-10-25 MED ORDER — IBUPROFEN 600 MG PO TABS: 600.0000 mg | ORAL_TABLET | Freq: Four times a day (QID) | ORAL | 0 refills | Status: DC

## 2017-10-25 MED ORDER — LABETALOL HCL 5 MG/ML IV SOLN
20.0000 mg | INTRAVENOUS | Status: AC | PRN
  Administered 2017-10-25: 80 mg via INTRAVENOUS
  Administered 2017-10-25: 40 mg via INTRAVENOUS
  Administered 2017-10-25: 20 mg via INTRAVENOUS
  Filled 2017-10-25: qty 8
  Filled 2017-10-25: qty 16

## 2017-10-25 NOTE — Discharge Summary (Signed)
OB Discharge Summary     Patient Name: Amanda Meza DOB: 1991/05/02 MRN: [id number]  Date of admission: 10/20/2017 Delivering MD: Levie Heritage   Date of discharge: 10/25/2017  Admitting diagnosis: 33wks ctx Intrauterine pregnancy: [redacted]w[redacted]d     Secondary diagnosis:  Principal Problem:   Chronic hypertension with superimposed preeclampsia Active Problems:   Supervision of high risk pregnancy, antepartum   Gestational diabetes  Additional problems: none     Discharge diagnosis: Preterm Pregnancy Delivered, Preeclampsia (severe) and CHTN with superimposed preeclampsia                                                                                                Post partum procedures:none  Augmentation: Cytotec and Foley Balloon  Complications: None  Hospital course:  Induction of Labor With Cesarean Section  27 y.o. yo G1P0101 at [redacted]w[redacted]d was admitted to the hospital 10/20/2017 for induction of labor. Patient had a labor course significant for category 2 strip and hypertension. The patient went for cesarean section due to Non-Reassuring FHR, and delivered a Viable infant,10/21/2017  Membrane Rupture Time/Date: 1:57 AM ,10/21/2017   Details of operation can be found in separate operative Note.  Patient had an uncomplicated postpartum course. She is ambulating, tolerating a regular diet, passing flatus, and urinating well.  Patient is discharged home in stable condition on 10/25/17.                                    Physical exam  Vitals:   10/25/17 0724 10/25/17 0731 10/25/17 0741 10/25/17 0758  BP: (!) 171/99 (!) 150/89 128/79 124/82  Pulse: 68 70 79 74  Resp:    18  Temp:    98.4 F (36.9 C)  TempSrc:    Oral  SpO2:  100% 100% 100%  Weight:      Height:       General: alert, cooperative and no distress Lochia: appropriate Uterine Fundus: firm Incision: Healing well with no significant drainage DVT Evaluation: No evidence of DVT seen on physical exam. Labs: Lab  Results  Component Value Date   WBC 11.8 (H) 10/24/2017   HGB 9.0 (L) 10/24/2017   HCT 25.8 (L) 10/24/2017   MCV 87.8 10/24/2017   PLT 132 (L) 10/24/2017   CMP Latest Ref Rng & Units 10/24/2017  Glucose 65 - 99 mg/dL 86  BUN 6 - 20 mg/dL 10  Creatinine 4.09 - 8.11 mg/dL 9.14  Sodium 782 - 956 mmol/L 139  Potassium 3.5 - 5.1 mmol/L 3.7  Chloride 101 - 111 mmol/L 105  CO2 22 - 32 mmol/L 25  Calcium 8.9 - 10.3 mg/dL 7.7(L)  Total Protein 6.5 - 8.1 g/dL 2.1(H)  Total Bilirubin 0.3 - 1.2 mg/dL 0.4  Alkaline Phos 38 - 126 U/L 113  AST 15 - 41 U/L 88(H)  ALT 14 - 54 U/L 191(H)    Discharge instruction: per After Visit Summary and "Baby and Me Booklet".  After visit meds:  Allergies as of 10/25/2017  Reactions   Penicillins Rash   Has patient had a PCN reaction causing immediate rash, facial/tongue/throat swelling, SOB or lightheadedness with hypotension: Yes Has patient had a PCN reaction causing severe rash involving mucus membranes or skin necrosis: Yes Has patient had a PCN reaction that required hospitalization: No Has patient had a PCN reaction occurring within the last 10 years: No If all of the above answers are "NO", then may proceed with Cephalosporin use.      Medication List    STOP taking these medications   aspirin EC 81 MG tablet   ondansetron 4 MG disintegrating tablet Commonly known as:  ZOFRAN ODT     TAKE these medications   ibuprofen 600 MG tablet Commonly known as:  ADVIL,MOTRIN Take 1 tablet (600 mg total) by mouth every 6 (six) hours.   NIFEdipine 60 MG 24 hr tablet Commonly known as:  PROCARDIA-XL/ADALAT CC Take 1 tablet (60 mg total) by mouth 2 (two) times daily.   oxyCODONE 5 MG immediate release tablet Commonly known as:  Oxy IR/ROXICODONE Take 1-2 tablets (5-10 mg total) by mouth every 6 (six) hours as needed (pain scale 4-7).       Diet: carb modified diet  Activity: Advance as tolerated. Pelvic rest for 6 weeks.   Outpatient  follow NW:GNFAup:this week for BP check Follow up Appt:No future appointments. Follow up Visit:No follow-ups on file.  Postpartum contraception: Combination OCPs  Newborn Data: Live born female  Birth Weight: 3 lb (1360 g) APGAR: 2, 6  Newborn Delivery   Birth date/time:  10/21/2017 01:58:00 Delivery type:  C-Section, Low Vertical Trial of labor:  Yes C-section categorization:  Primary     Baby Feeding: Breast Disposition:NICU   10/25/2017 Scheryl DarterJames Arnold, MD

## 2017-10-25 NOTE — Progress Notes (Signed)
After Labetalol 40mg   IV   10/25/17 0657  Vital Signs  BP (!) 196/102  BP Location Right Arm  Patient Position (if appropriate) Left side lying/tilt  Pulse Rate 67  Pulse Rate Source Dinamap  Resp 18

## 2017-10-25 NOTE — Progress Notes (Signed)

## 2017-10-25 NOTE — Progress Notes (Signed)
   10/25/17 0731  Vital Signs  BP (!) 150/89  BP Location Right Arm  Patient Position (if appropriate) Semi-fowlers  BP Method Automatic  Pulse Rate 70  Pulse Rate Source Dinamap  Oxygen Therapy  SpO2 100 %  O2 Device  (rppm air)   After Hydralazine 10mg  IV

## 2017-10-25 NOTE — Progress Notes (Signed)
   10/25/17 0711  Vital Signs  BP (!) 175/100  BP Location Right Arm  Patient Position (if appropriate) Semi-fowlers  BP Method Automatic  Pulse Rate 62   Labetalol 80mg  IV given

## 2017-10-25 NOTE — Lactation Note (Addendum)
This note was copied from a baby's chart. Lactation Consultation Note  Patient Name: Amanda Meza GYBWL'S Date: 10/25/2017   Ms. Gatchalian had been discharged from her 3rd floor room & had left some pump parts in her room. I took the parts to her in the NICU to show her how to use hand pump (double-mode & single-mode) that was included in pump kit.   Mom says she has no questions about pumping & that she received [facility].  Mom noted to be prescribed nifedipine '60mg'$  bid (L2) and oxycodone '5mg'$ , 1-2 tabs q6hrs prn (L3) at discharge.   Matthias [facility]hs [facility] 10/25/2017, 2:28 PM

## 2017-10-25 NOTE — Discharge Instructions (Signed)
Cesarean Delivery, Care After °Refer to this sheet in the next few weeks. These instructions provide you with information about caring for yourself after your procedure. Your health care provider may also give you more specific instructions. Your treatment has been planned according to current medical practices, but problems sometimes occur. Call your health care provider if you have any problems or questions after your procedure. °What can I expect after the procedure? °After the procedure, it is common to have: °· A small amount of blood or clear fluid coming from the incision. °· Some redness, swelling, and pain in your incision area. °· Some abdominal pain and soreness. °· Vaginal bleeding (lochia). °· Pelvic cramps. °· Fatigue. ° °Follow these instructions at home: °Incision care ° °· Follow instructions from your health care provider about how to take care of your incision. Make sure you: °? Wash your hands with soap and water before you change your bandage (dressing). If soap and water are not available, use hand sanitizer. °? If you have a dressing, change it as told by your health care provider. °? Leave stitches (sutures), skin staples, skin glue, or adhesive strips in place. These skin closures may need to stay in place for 2 weeks or longer. If adhesive strip edges start to loosen and curl up, you may trim the loose edges. Do not remove adhesive strips completely unless your health care provider tells you to do that. °· Check your incision area every day for signs of infection. Check for: °? More redness, swelling, or pain. °? More fluid or blood. °? Warmth. °? Pus or a bad smell. °· When you cough or sneeze, hug a pillow. This helps with pain and decreases the chance of your incision opening up (dehiscing). Do this until your incision heals. °Medicines °· Take over-the-counter and prescription medicines only as told by your health care provider. °· If you were prescribed an antibiotic medicine, take it  as told by your health care provider. Do not stop taking the antibiotic until it is finished. °Driving °· Do not drive or operate heavy machinery while taking prescription pain medicine. °Lifestyle °· Do not drink alcohol. This is especially important if you are breastfeeding or taking pain medicine. °· Do not use tobacco products, including cigarettes, chewing tobacco, or e-cigarettes. If you need help quitting, ask your health care provider. Tobacco can delay wound healing. °Eating and drinking °· Drink at least 8 eight-ounce glasses of water every day unless told not to by your health care provider. If you breastfeed, you may need to drink more water than this. °· Eat high-fiber foods every day. These foods may help prevent or relieve constipation. High-fiber foods include: °? Whole grain cereals and breads. °? Brown rice. °? Beans. °? Fresh fruits and vegetables. °Activity °· Return to your normal activities as told by your health care provider. Ask your health care provider what activities are safe for you. °· Rest as much as possible. Try to rest or take a nap while your baby is sleeping. °· Do not lift anything that is heavier than your baby or 10 lb (4.5 kg) as told by your health care provider. °· Ask your health care provider when you can engage in sexual activity. This may depend on your: °? Risk of infection. °? Healing rate. °? Comfort and desire to engage in sexual activity. °Bathing °· Do not take baths, swim, or use a hot tub until your health care provider approves. Ask your health care provider if   you can take showers. You may only be allowed to take sponge baths until your incision heals. °General instructions °· Do not use tampons or douches until your health care provider approves. °· Wear: °? Loose, comfortable clothing. °? A supportive and well-fitting bra. °· Watch for any blood clots that may pass from your vagina. These may look like clumps of dark red, brown, or black discharge. °· Keep  your perineum clean and dry as told by your health care provider. °· Wipe from front to back when you use the toilet. °· If possible, have someone help you care for your baby and help with household activities for a few days after you leave the hospital. °· Keep all follow-up visits for you and your baby as told by your health care provider. This is important. °Contact a health care provider if: °· You have: °? Bad-smelling vaginal discharge. °? Difficulty urinating. °? Pain when urinating. °? A sudden increase or decrease in the frequency of your bowel movements. °? More redness, swelling, or pain around your incision. °? More fluid or blood coming from your incision. °? Pus or a bad smell coming from your incision. °? A fever. °? A rash. °? Little or no interest in activities you used to enjoy. °? Questions about caring for yourself or your baby. °? Nausea. °· Your incision feels warm to the touch. °· Your breasts turn red or become painful or hard. °· You feel unusually sad or worried. °· You vomit. °· You pass large blood clots from your vagina. If you pass a blood clot, save it to show to your health care provider. Do not flush blood clots down the toilet without showing your health care provider. °· You urinate more than usual. °· You are dizzy or light-headed. °· You have not breastfed and have not had a menstrual period for 12 weeks after delivery. °· You stopped breastfeeding and have not had a menstrual period for 12 weeks after stopping breastfeeding. °Get help right away if: °· You have: °? Pain that does not go away or get better with medicine. °? Chest pain. °? Difficulty breathing. °? Blurred vision or spots in your vision. °? Thoughts about hurting yourself or your baby. °? New pain in your abdomen or in one of your legs. °? A severe headache. °· You faint. °· You bleed from your vagina so much that you fill two sanitary pads in one hour. °This information is not intended to replace advice given to  you by your health care provider. Make sure you discuss any questions you have with your health care provider. °Document Released: 01/25/2002 Document Revised: 06/07/2016 Document Reviewed: 04/09/2015 °Elsevier Interactive Patient Education © 2018 [employer] ° ° ° °Preeclampsia and Eclampsia °Preeclampsia is a serious condition that develops only during pregnancy. It is also called toxemia of pregnancy. This condition causes high blood pressure along with other symptoms, such as swelling and headaches. These symptoms may develop as the condition gets worse. Preeclampsia may occur at 20 weeks of pregnancy or later. °Diagnosing and treating preeclampsia early is very important. If not treated early, it can cause serious problems for you and your baby. One problem it can lead to is eclampsia, which is a condition that causes muscle jerking or shaking (convulsions or seizures) in the mother. Delivering your baby is the best treatment for preeclampsia or eclampsia. Preeclampsia and eclampsia symptoms usually go away after your baby is born. °What are the causes? °The cause of preeclampsia is   not known. °What increases the risk? °The following risk factors make you more likely to develop preeclampsia: °· Being pregnant for the first time. °· Having had preeclampsia during a past pregnancy. °· Having a family history of preeclampsia. °· Having high blood pressure. °· Being pregnant with twins or triplets. °· Being 35 or older. °· Being African-American. °· Having kidney disease or diabetes. °· Having medical conditions such as lupus or blood diseases. °· Being very overweight (obese). ° °What are the signs or symptoms? °The earliest signs of preeclampsia are: °· High blood pressure. °· Increased protein in your urine. Your health care provider will check for this at every visit before you give birth (prenatal visit). ° °Other symptoms that may develop as the condition gets worse include: °· Severe headaches. °· Sudden  weight gain. °· Swelling of the hands, face, legs, and feet. °· Nausea and vomiting. °· Vision problems, such as blurred or double vision. °· Numbness in the face, arms, legs, and feet. °· Urinating less than usual. °· Dizziness. °· Slurred speech. °· Abdominal pain, especially upper abdominal pain. °· Convulsions or seizures. ° °Symptoms generally go away after giving birth. °How is this diagnosed? °There are no screening tests for preeclampsia. Your health care provider will ask you about symptoms and check for signs of preeclampsia during your prenatal visits. You may also have tests that include: °· Urine tests. °· Blood tests. °· Checking your blood pressure. °· Monitoring your baby’s heart rate. °· Ultrasound. ° °How is this treated? °You and your health care provider will determine the treatment approach that is best for you. Treatment may include: °· Having more frequent prenatal exams to check for signs of preeclampsia, if you have an increased risk for preeclampsia. °· Bed rest. °· Reducing how much salt (sodium) you eat. °· Medicine to lower your blood pressure. °· Staying in the hospital, if your condition is severe. There, treatment will focus on controlling your blood pressure and the amount of fluids in your body (fluid retention). °· You may need to take medicine (magnesium sulfate) to prevent seizures. This medicine may be given as an injection or through an IV tube. °· Delivering your baby early, if your condition gets worse. You may have your labor started with medicine (induced), or you may have a cesarean delivery. ° °Follow these instructions at home: °Eating and drinking ° °· Drink enough fluid to keep your urine clear or pale yellow. °· Eat a healthy diet that is low in sodium. Do not add salt to your food. Check nutrition labels to see how much sodium a food or beverage contains. °· Avoid caffeine. °Lifestyle °· Do not use any products that contain nicotine or tobacco, such as cigarettes and  e-cigarettes. If you need help quitting, ask your health care provider. °· Do not use alcohol or drugs. °· Avoid stress as much as possible. Rest and get plenty of sleep. °General instructions °· Take over-the-counter and prescription medicines only as told by your health care provider. °· When lying down, lie on your side. This keeps pressure off of your baby. °· When sitting or lying down, raise (elevate) your feet. Try putting some pillows underneath your lower legs. °· Exercise regularly. Ask your health care provider what kinds of exercise are best for you. °· Keep all follow-up and prenatal visits as told by your health care provider. This is important. °How is this prevented? °To prevent preeclampsia or eclampsia from developing during another pregnancy: °· Get proper   medical care during pregnancy. Your health care provider may be able to prevent preeclampsia or diagnose and treat it early. °· Your health care provider may have you take a low-dose aspirin or a calcium supplement during your next pregnancy. °· You may have tests of your blood pressure and kidney function after giving birth. °· Maintain a healthy weight. Ask your health care provider for help managing weight gain during pregnancy. °· Work with your health care provider to manage any long-term (chronic) health conditions you have, such as diabetes or kidney problems. ° °Contact a health care provider if: °· You gain more weight than expected. °· You have headaches. °· You have nausea or vomiting. °· You have abdominal pain. °· You feel dizzy or light-headed. °Get help right away if: °· You develop sudden or severe swelling anywhere in your body. This usually happens in the legs. °· You gain 5 lbs (2.3 kg) or more during one week. °· You have severe: °? Abdominal pain. °? Headaches. °? Dizziness. °? Vision problems. °? Confusion. °? Nausea or vomiting. °· You have a seizure. °· You have trouble moving any part of your body. °· You develop  numbness in any part of your body. °· You have trouble speaking. °· You have any abnormal bleeding. °· You pass out. °This information is not intended to replace advice given to you by your health care provider. Make sure you discuss any questions you have with your health care provider. °Document Released: 05/02/2000 Document Revised: 01/01/2016 Document Reviewed: 12/10/2015 °Elsevier Interactive Patient Education © 2018 [employer] ° °

## 2017-10-25 NOTE — Progress Notes (Signed)
Subjective:no headache Postpartum Day 4: Cesarean Delivery Patient reports incisional pain and tolerating PO.    Objective: Vital signs in last 24 hours: Temp:  [98.1 F (36.7 C)-98.7 F (37.1 C)] 98.5 F (36.9 C) (06/09 0007) Pulse Rate:  [60-86] 72 (06/09 0613) Resp:  [16-19] 18 (06/09 0613) BP: (137-199)/(90-112) 196/103 (06/09 0613) SpO2:  [100 %] 100 % (06/09 0346)  Physical Exam:  General: alert, cooperative and no distress Lochia: appropriate Uterine Fundus: firm Incision: healing well DVT Evaluation: No evidence of DVT seen on physical exam.  Recent Labs    10/22/17 1757 10/24/17 0643  HGB 10.9* 9.0*  HCT 30.7* 25.8*    Assessment/Plan: Status post Cesarean section. Postoperative course complicated by hypertension  Procardia for BP control.  Scheryl DarterJames Arnold 10/25/2017, 6:37 AM

## 2017-10-26 ENCOUNTER — Encounter: Payer: Medicaid Other | Admitting: Obstetrics & Gynecology

## 2017-10-26 ENCOUNTER — Other Ambulatory Visit: Payer: Medicaid Other

## 2017-11-02 ENCOUNTER — Encounter: Payer: Medicaid Other | Admitting: Obstetrics & Gynecology

## 2017-11-02 ENCOUNTER — Other Ambulatory Visit: Payer: Medicaid Other

## 2017-11-09 ENCOUNTER — Other Ambulatory Visit: Payer: Medicaid Other

## 2017-11-09 ENCOUNTER — Encounter: Payer: Medicaid Other | Admitting: Obstetrics & Gynecology

## 2018-01-02 ENCOUNTER — Other Ambulatory Visit: Payer: Self-pay | Admitting: Obstetrics & Gynecology

## 2018-01-16 ENCOUNTER — Other Ambulatory Visit: Payer: Self-pay | Admitting: Student

## 2018-01-16 ENCOUNTER — Other Ambulatory Visit: Payer: Self-pay | Admitting: Obstetrics & Gynecology

## 2018-01-16 ENCOUNTER — Encounter: Payer: Self-pay | Admitting: Student

## 2018-01-16 DIAGNOSIS — I10 Essential (primary) hypertension: Secondary | ICD-10-CM

## 2018-01-16 MED ORDER — NIFEDIPINE ER 60 MG PO TB24: 60.0000 mg | ORAL_TABLET | Freq: Two times a day (BID) | ORAL | 0 refills | Status: DC

## 2018-01-16 NOTE — Telephone Encounter (Signed)
[facility] today, asking for refils for her Procardia. Work number is [phone]. Pt has been given a week's refil of procardia, and asked to make f/u appt now that she is not pregnant,  Pt advised of Rx, and need to be seen along with recent bp's to consider whether to switch from Procardia.

## 2018-01-16 NOTE — Progress Notes (Signed)
Patient called in to nurse regarding rx for procardia that was supposed to be sent to pharmacy.  Per review of records, pt had 33 wk delivery for chronic hypertension w/superimposed preeclampsia. Was discharged home on procardia xl 60 mg BID with 2 refills. Patient has not had any follow up appointments since being discharged from the hospital. There are 2 refill requests (8/17 & today). The request on 8/17 was declined d/t need for patient to be seen in office.  Dr. Emelda FearFerguson attempted to contact patient today but was unable to get through. Per Dr. Emelda FearFerguson will give a 1 week supply of medication until she can f/u with the office.   Judeth HornLawrence, Shauntell Iglesia, NP

## 2018-03-07 ENCOUNTER — Other Ambulatory Visit: Payer: Self-pay | Admitting: Obstetrics and Gynecology

## 2018-03-10 ENCOUNTER — Other Ambulatory Visit: Payer: Self-pay | Admitting: Obstetrics and Gynecology

## 2018-03-13 ENCOUNTER — Other Ambulatory Visit: Payer: Self-pay | Admitting: Obstetrics and Gynecology

## 2018-03-26 ENCOUNTER — Other Ambulatory Visit: Payer: Self-pay | Admitting: Obstetrics and Gynecology

## 2018-03-29 ENCOUNTER — Other Ambulatory Visit: Payer: Self-pay | Admitting: *Deleted

## 2018-03-29 NOTE — Telephone Encounter (Signed)
Received a voice message from [place], a pharmacist at [facility] stating she is requesting a refill on nifedipine ER 60mg   Bid written by Dr. Emelda Fear- states she is completely out. Per chart is postpartum patient .

## 2018-03-30 ENCOUNTER — Other Ambulatory Visit: Payer: Self-pay | Admitting: Obstetrics & Gynecology

## 2018-03-30 DIAGNOSIS — O10919 Unspecified pre-existing hypertension complicating pregnancy, unspecified trimester: Secondary | ICD-10-CM

## 2018-04-06 NOTE — Telephone Encounter (Signed)
Attempted to contact pt unable to LM due to no VM.  MyChart message sent.

## 2018-04-20 ENCOUNTER — Other Ambulatory Visit: Payer: Self-pay | Admitting: Obstetrics & Gynecology

## 2018-04-20 DIAGNOSIS — O10919 Unspecified pre-existing hypertension complicating pregnancy, unspecified trimester: Secondary | ICD-10-CM

## 2018-04-25 ENCOUNTER — Other Ambulatory Visit: Payer: Self-pay

## 2018-04-25 DIAGNOSIS — O10919 Unspecified pre-existing hypertension complicating pregnancy, unspecified trimester: Secondary | ICD-10-CM

## 2018-04-26 ENCOUNTER — Other Ambulatory Visit: Payer: Self-pay | Admitting: General Practice

## 2018-04-26 DIAGNOSIS — I10 Essential (primary) hypertension: Secondary | ICD-10-CM

## 2018-04-26 MED ORDER — NIFEDIPINE ER OSMOTIC RELEASE 30 MG PO TB24: 60.0000 mg | ORAL_TABLET | Freq: Every day | ORAL | 0 refills | Status: DC

## 2018-05-18 ENCOUNTER — Ambulatory Visit (INDEPENDENT_AMBULATORY_CARE_PROVIDER_SITE_OTHER): Payer: Medicaid Other | Admitting: Physician Assistant

## 2018-07-06 ENCOUNTER — Other Ambulatory Visit: Payer: Self-pay | Admitting: Obstetrics & Gynecology

## 2018-07-06 DIAGNOSIS — O10919 Unspecified pre-existing hypertension complicating pregnancy, unspecified trimester: Secondary | ICD-10-CM

## 2018-09-06 ENCOUNTER — Other Ambulatory Visit: Payer: Self-pay | Admitting: Obstetrics & Gynecology

## 2018-09-06 DIAGNOSIS — O10919 Unspecified pre-existing hypertension complicating pregnancy, unspecified trimester: Secondary | ICD-10-CM

## 2018-10-02 ENCOUNTER — Other Ambulatory Visit: Payer: Self-pay | Admitting: Obstetrics & Gynecology

## 2018-10-02 DIAGNOSIS — O10919 Unspecified pre-existing hypertension complicating pregnancy, unspecified trimester: Secondary | ICD-10-CM

## 2018-10-05 ENCOUNTER — Telehealth: Payer: Self-pay

## 2018-10-05 ENCOUNTER — Other Ambulatory Visit: Payer: Self-pay

## 2018-10-05 DIAGNOSIS — O10919 Unspecified pre-existing hypertension complicating pregnancy, unspecified trimester: Secondary | ICD-10-CM

## 2018-10-05 NOTE — Telephone Encounter (Signed)
Opened in Error.

## 2018-10-05 NOTE — Telephone Encounter (Signed)
Respond via MyChart

## 2018-10-08 ENCOUNTER — Other Ambulatory Visit: Payer: Self-pay | Admitting: Obstetrics & Gynecology

## 2018-10-08 DIAGNOSIS — O10919 Unspecified pre-existing hypertension complicating pregnancy, unspecified trimester: Secondary | ICD-10-CM

## 2018-11-08 ENCOUNTER — Other Ambulatory Visit: Payer: Self-pay | Admitting: Obstetrics & Gynecology

## 2018-11-08 DIAGNOSIS — O10919 Unspecified pre-existing hypertension complicating pregnancy, unspecified trimester: Secondary | ICD-10-CM

## 2019-02-18 IMAGING — US US MFM OB DETAIL+14 WK
1 series · 13 of 28 positions shown · non-contrast
Comparison: none

[facility]

[facility] Clinic
1  BORJE BUKHAMSEEN             [id number]      [id number]     [id number]
Indications
19 weeks gestation of pregnancy
Encounter for antenatal screening for
malformations
Hypertension - Chronic/Pre-existing -
labetalol
Fetal Evaluation
Num Of Fetuses:     1
Fetal Heart         136
Rate(bpm):
Cardiac Activity:   Observed
Presentation:       Cephalic
Placenta:           Posterior, above cervical os
P. Cord Insertion:  Visualized
Amniotic Fluid
AFI FV:      Subjectively within normal limits
Largest Pocket(cm)
4.57
Biometry
BPD:      43.3  mm     G. Age:  19w 1d         49  %    CI:         81.2   %    70 - 86
FL/HC:      17.5   %    16.1 -
HC:      151.7  mm     G. Age:  18w 1d          8  %    HC/AC:      1.06        1.09 -
AC:      143.5  mm     G. Age:  19w 5d         63  %    FL/BPD:     61.4   %
FL:       26.6  mm     G. Age:  18w 1d         12  %    FL/AC:      18.5   %    20 - 24
HUM:        27  mm     G. Age:  18w 4d         36  %
CER:      19.4  mm     G. Age:  18w 5d         38  %
NFT:       5.7  mm
CM:          3  mm
Est. FW:     262  gm      0 lb 9 oz     40  %
Gestational Age
U/S Today:     18w 6d                                        EDD:   12/10/17
Best:          19w 1d     Det. By:  U/S C R L  (05/05/17)    EDD:   12/08/17
Anatomy
Cranium:               Appears normal         LVOT:                   Appears normal
Cavum:                 Appears normal         Aortic Arch:            Appears normal
Ventricles:            Appears normal         Ductal Arch:            Appears normal
Choroid Plexus:        Appears normal         Diaphragm:              Appears normal
Cerebellum:            Appears normal         Stomach:                Appears normal, left
sided
Posterior Fossa:       Appears normal         Abdomen:                Appears normal
Nuchal Fold:           Appears normal         Abdominal Wall:         Appears nml (cord
insert, abd wall)
Face:                  Appears normal         Cord Vessels:           Appears normal (3
(orbits and profile)                           vessel cord)
Lips:                  Appears normal         Kidneys:                Appear normal
Palate:                Appears normal         Bladder:                Appears normal
Thoracic:              Appears normal         Spine:                  Appears normal
Heart:                 Appears normal         Upper Extremities:      Appears normal
(4CH, axis, and
situs)
RVOT:                  Appears normal         Lower Extremities:      Appears normal
Other:  Female gender. Heels and 5th digit visualized.
Cervix Uterus Adnexa
Cervix
Length:           3.87  cm.
Normal appearance by transabdominal scan.
Uterus
Normal shape and size.
Left Ovary
Size(cm)       2.9  x   1.7    x  1.8       Vol(ml):
Within normal limits.
Right Ovary
Size(cm)       2.7  x   2.1    x  3.1       Vol(ml):
Cul De Sac:   No free fluid seen.
Adnexa:       No abnormality visualized.
Impression
INDICATION: 27 yr old G1P0 at 98w9d with chronic
hypertension for fetal anatomic survey.

[Series 1: us mfm ob detail+14 wk · 13 of 89 slices shown]
[im 4/89]
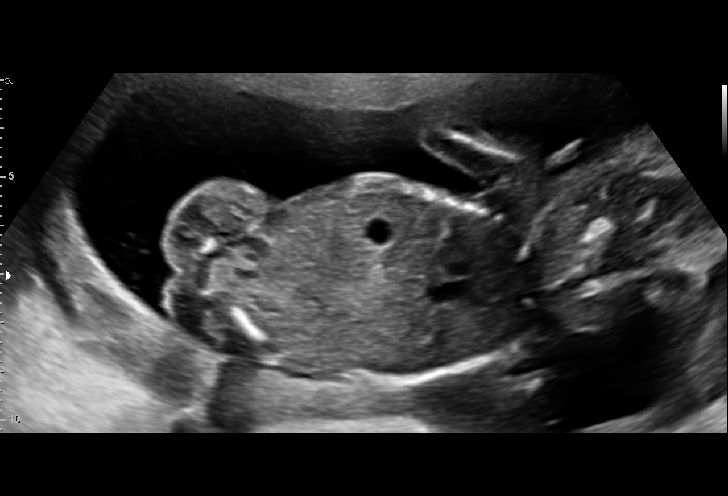
[im 10/89]
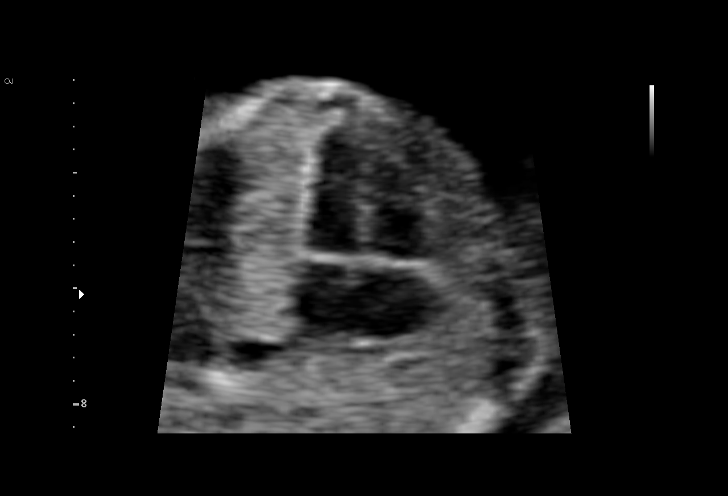
[im 17/89]
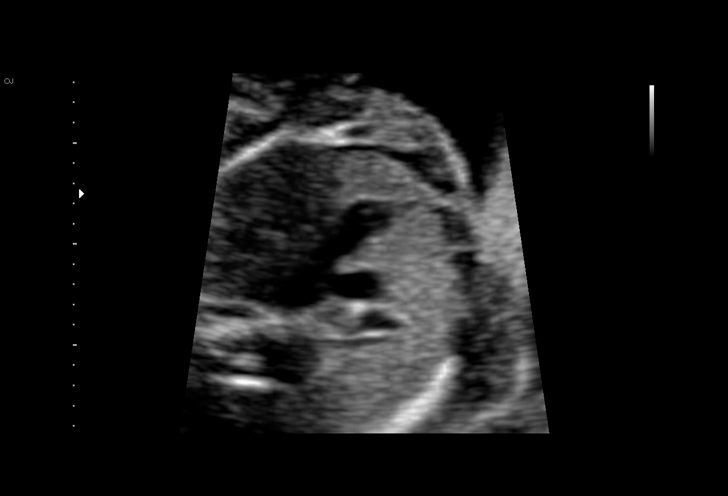
[im 23/89]
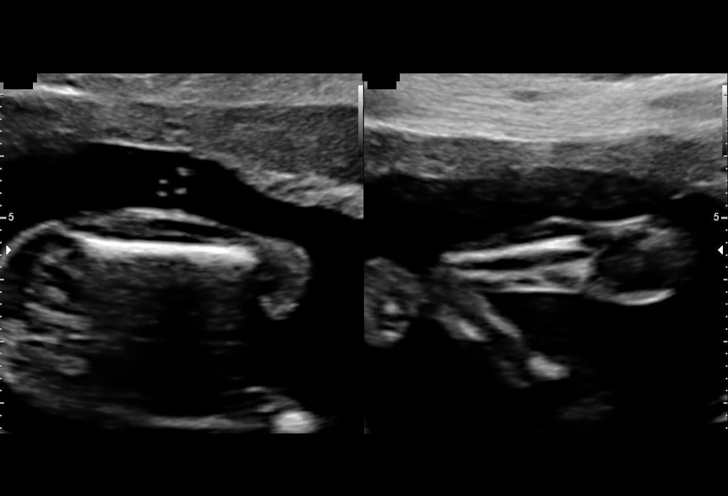
[im 30/89]
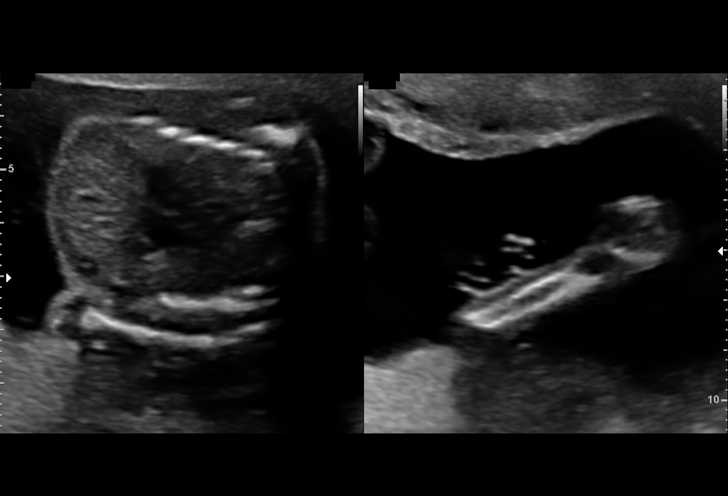
[im 36/89]
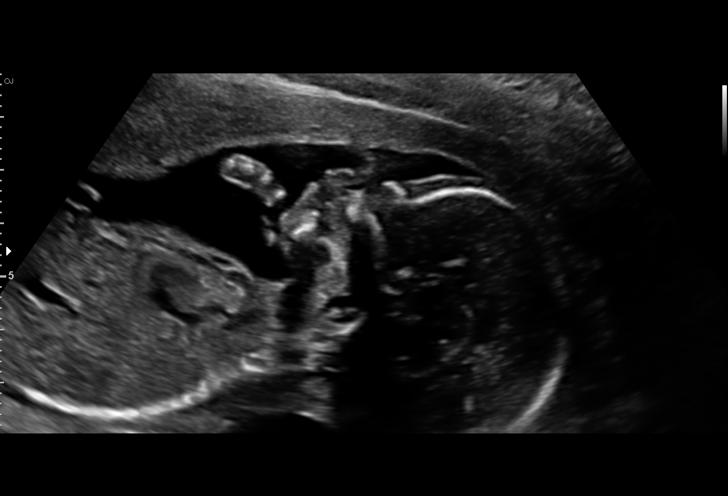
[im 46/89]
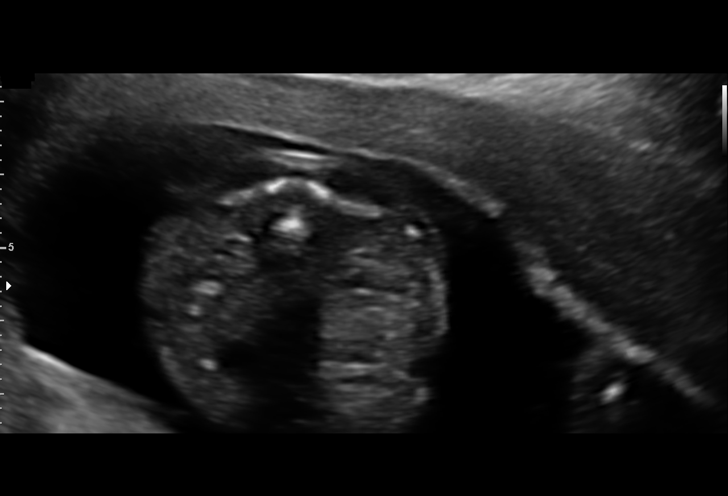
[im 53/89]
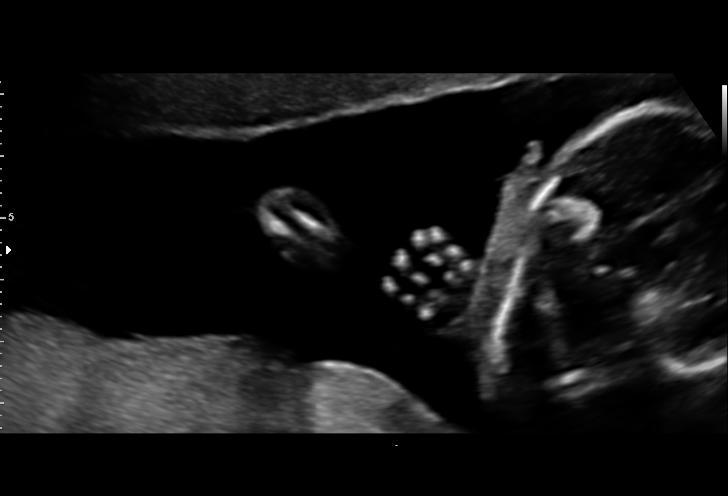
[im 59/89]
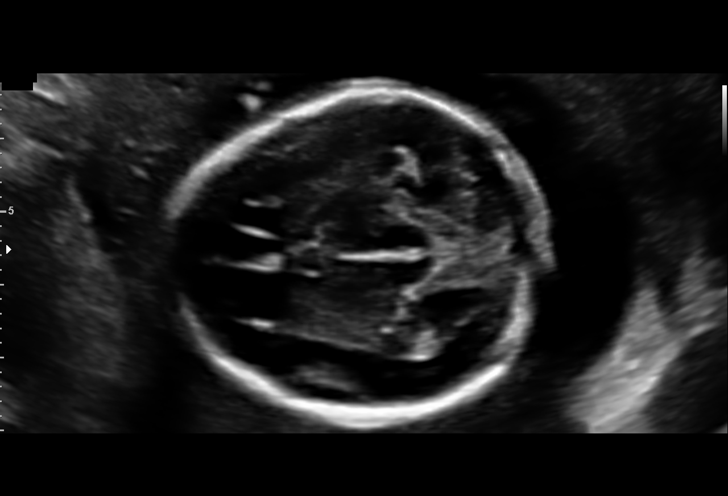
[im 66/89]
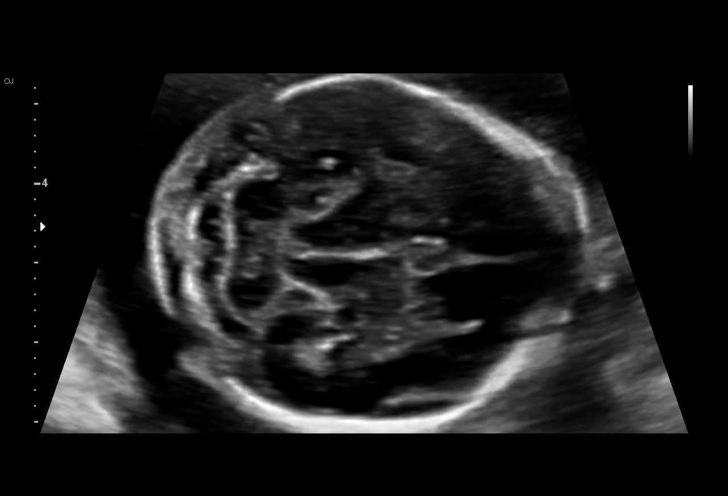
[im 72/89]
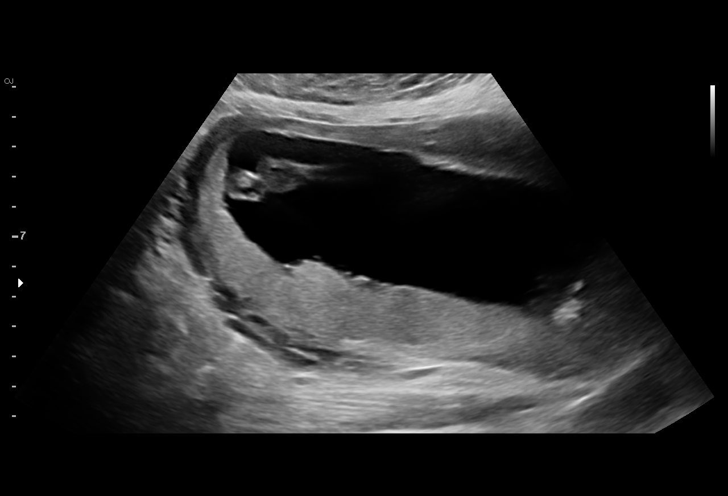
[im 79/89]
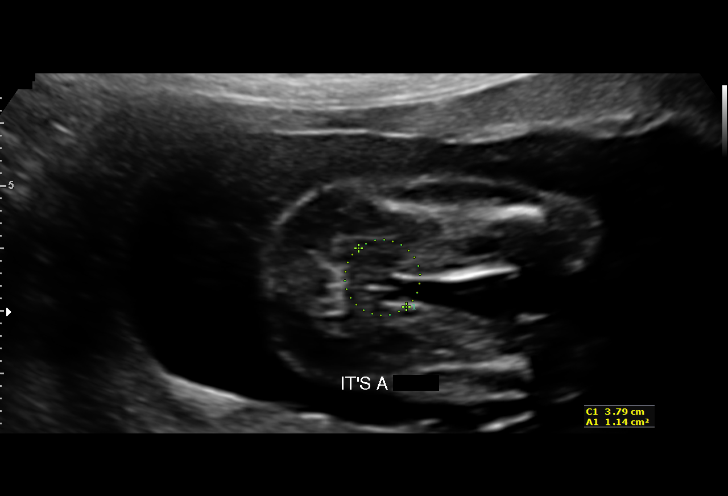
[im 85/89]
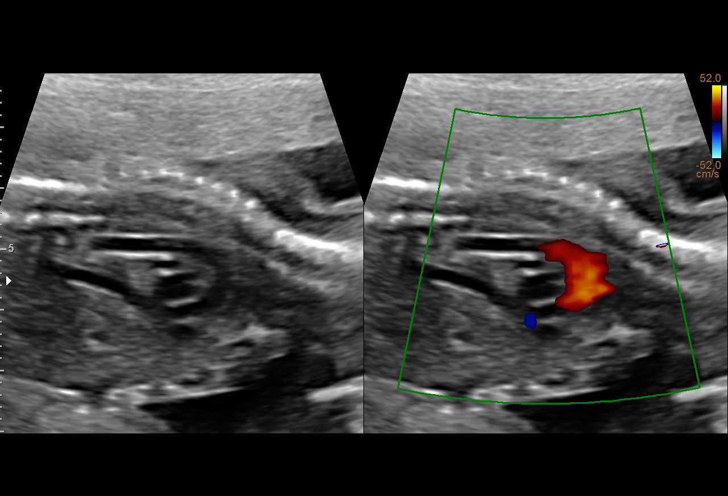

[13 of 28 positions shown; findings below may reference images not displayed]

FINDINGS: 1. Single intrauterine pregnancy with cardiac activity.
2. Fetal biometry is consistent with dating.
3. Posterior placenta without evidence of previa.
4. Normal amniotic fluid volume.
5. Normal transabdominal cervical length.
6. No adnexal masses seen.
7. Fetus is in cephalic presentation.
8. Normal fetal anatomic survey.
Recommendations

1. Appropriate fetal growth.
2. Normal fetal anatomic survey.
3. Low risk cell free fetal DNA.
4. Hypertension:
- on labetalol
- severe range BPs today
- recommend increase labetalol to 300mg bid (prescription
sent to pharmacy)
- recommend obtain home BP cuff and check 2-3x/day
(prescription sent to pharmacy)
- recommend adjust medications to keep BPs <150/100
- recommend start low dose aspirin 81mg/day to reduce risk
of preeclampsia (prescription sent to pharmacy)
- recommend fetal growth every 4 weeks
- recommend start antenatal testing at 32 weeks
- recommend close surveillance for the development of
signs/symptoms of preeclampsia

## 2019-03-25 IMAGING — US US MFM OB FOLLOW-UP
1 series · 14 of 28 positions shown · non-contrast
Comparison: none

[Series 1: us mfm ob follow-up · 14 of 28 slices shown]
[im 2/28]
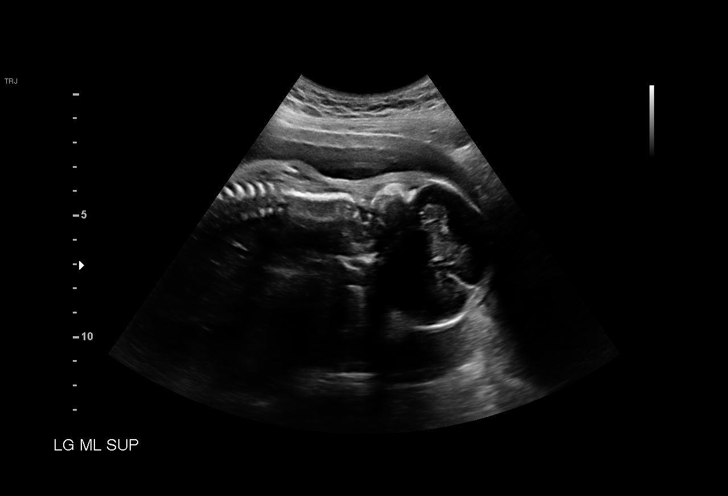
[im 4/28]
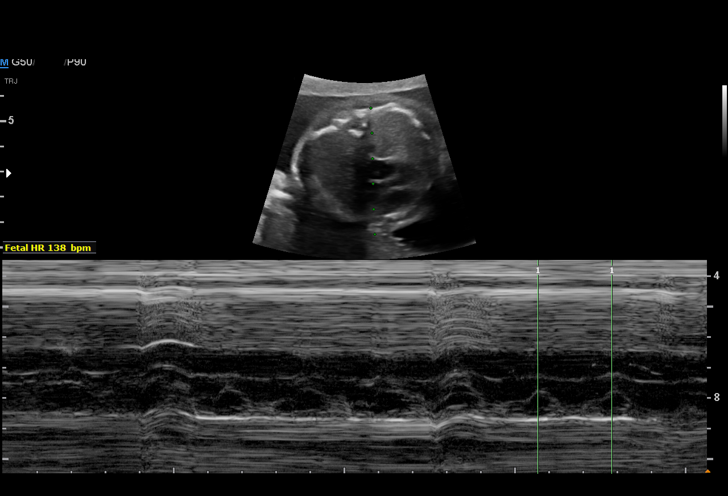
[im 6/28]
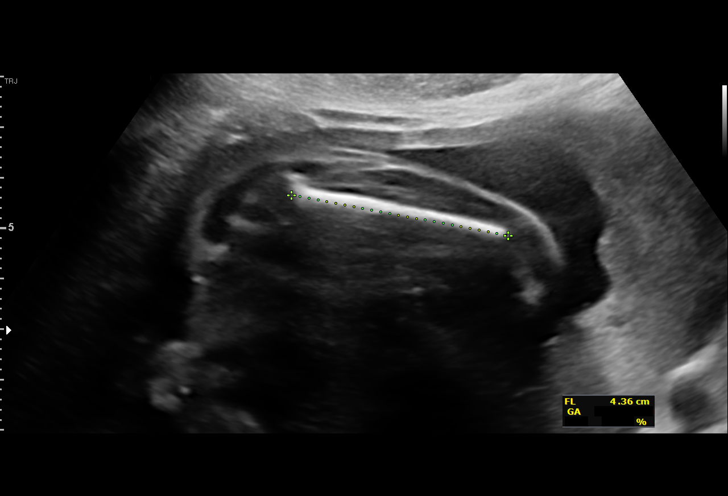
[im 8/28]
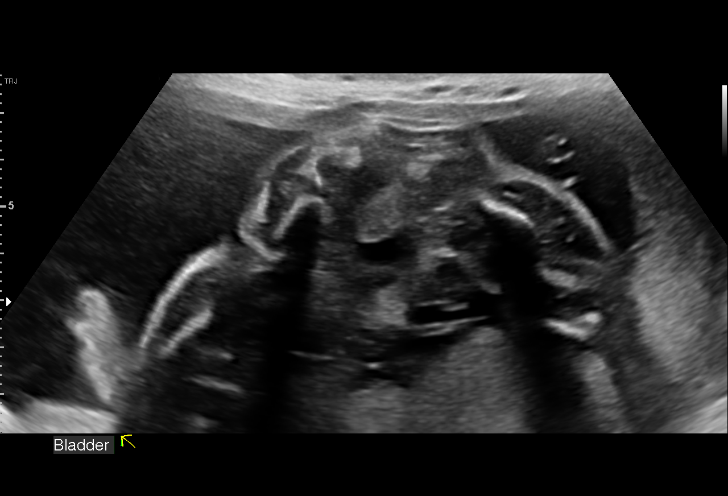
[im 10/28]
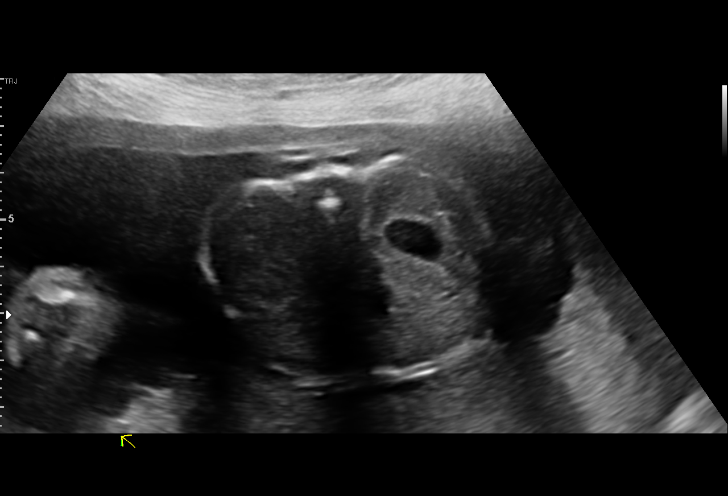
[im 12/28]
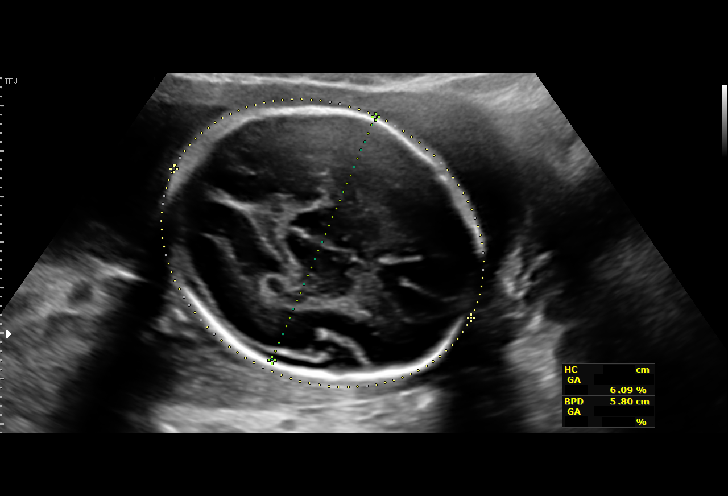
[im 14/28]
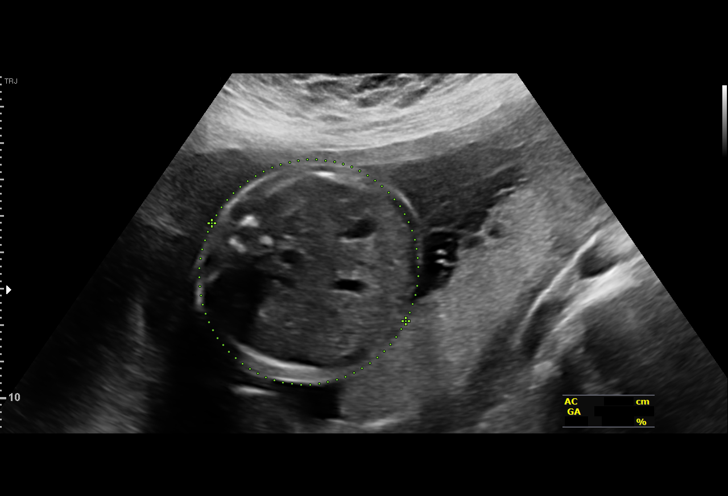
[im 16/28]
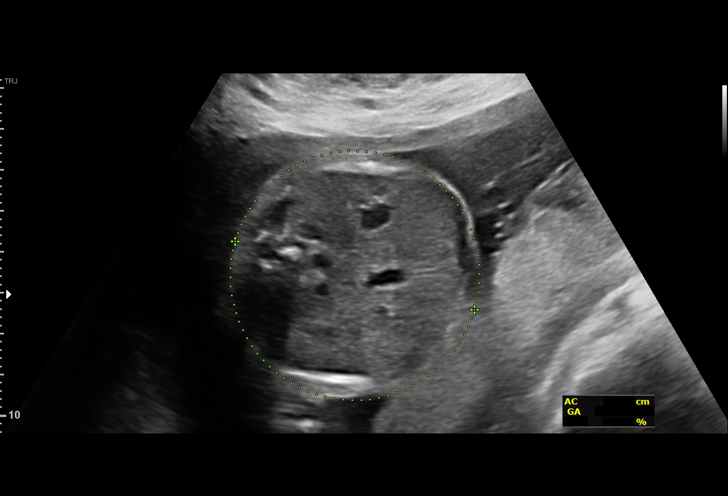
[im 18/28]
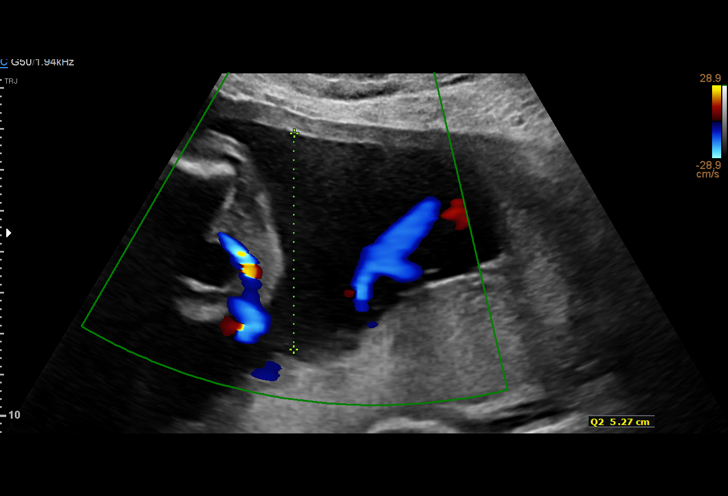
[im 20/28]
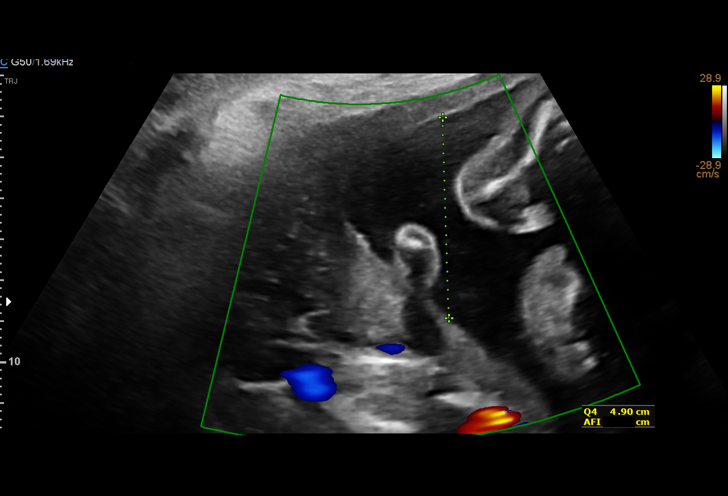
[im 22/28]
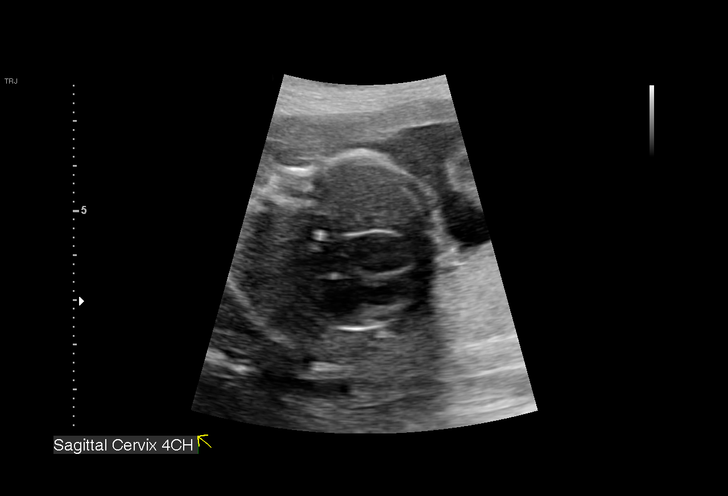
[im 24/28]
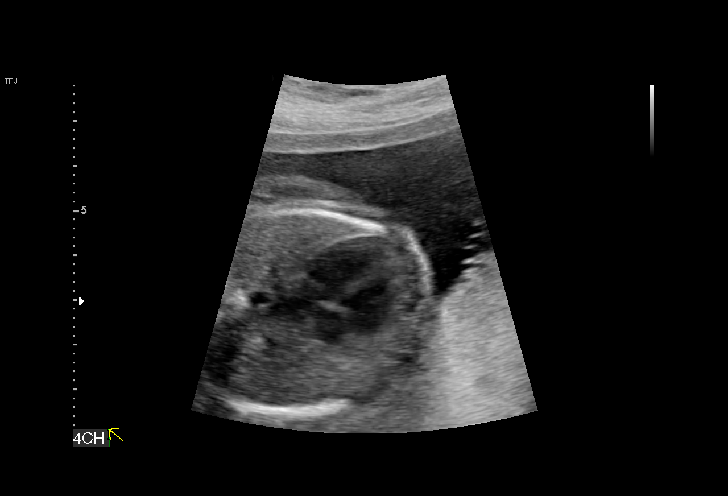
[im 26/28]
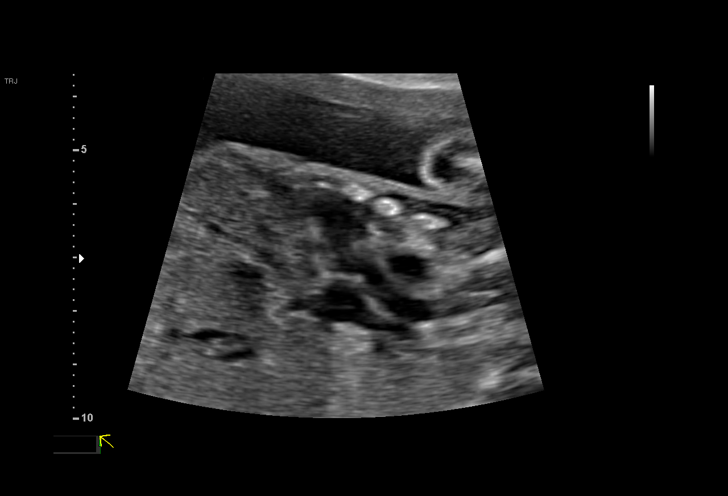
[im 28/28]
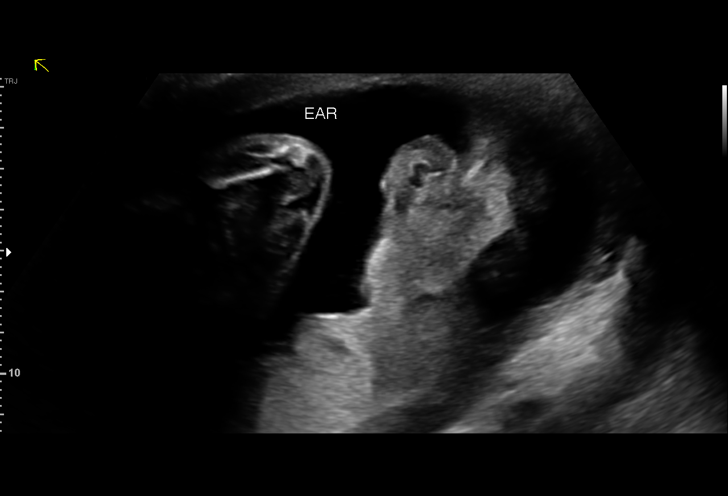

[14 of 28 positions shown; findings below may reference images not displayed]

[REDACTED]

1  EIMUTE            [id number]      [id number]     [id number]
Indications

24 weeks gestation of pregnancy
Encounter for fetal growth retardation
Hypertension - Chronic/Pre-existing
Fetal Evaluation

Num Of Fetuses:     1
Fetal Heart         138
Rate(bpm):
Cardiac Activity:   Observed
Presentation:       Cephalic
Placenta:           Posterior, above cervical os

Amniotic Fluid
AFI FV:      Subjectively within normal limits

AFI Sum(cm)     %Tile       Largest Pocket(cm)
18.59           74

RUQ(cm)       RLQ(cm)       LUQ(cm)        LLQ(cm)
3.29
Biometry

BPD:      57.7  mm     G. Age:  23w 5d         26  %    CI:         75.8   %    70 - 86
FL/HC:      20.7   %    18.7 -
HC:      210.1  mm     G. Age:  23w 1d          6  %    HC/AC:      1.13        1.05 -
AC:      185.6  mm     G. Age:  23w 3d         20  %    FL/BPD:     75.2   %    71 - 87
FL:       43.4  mm     G. Age:  24w 2d         40  %    FL/AC:      23.4   %    20 - 24

Est. FW:     617  gm      1 lb 6 oz     42  %
Gestational Age

U/S Today:     23w 5d                                        EDD:   12/11/17
Best:          24w 1d     Det. By:  U/S C R L  (05/05/17)    EDD:   12/08/17
Anatomy

Cranium:               Appears normal         LVOT:                   Appears normal
Cavum:                 Previously seen        Aortic Arch:            Previously seen
Ventricles:            Appears normal         Ductal Arch:            Previously seen
Choroid Plexus:        Previously seen        Diaphragm:              Previously seen
Cerebellum:            Previously seen        Stomach:                Appears normal, left
sided
Posterior Fossa:       Previously seen        Abdomen:                Previously seen
Nuchal Fold:           Previously seen        Abdominal Wall:         Previously seen
Face:                  Orbits and profile     Cord Vessels:           Previously seen
previously seen
Lips:                  Previously seen        Kidneys:                Appear normal
Palate:                Previously seen        Bladder:                Appears normal
Thoracic:              Previously seen        Spine:                  Previously seen
Heart:                 Appears normal         Upper Extremities:      Previously seen
(4CH, axis, and
situs)
RVOT:                  Previously seen        Lower Extremities:      Previously seen

Other:  Female gender previously seen. Technically difficult due to fetal
position.
Cervix Uterus Adnexa

Cervix
Length:            4.2  cm.
Normal appearance by transabdominal scan.
Impression

Singleton intrauterine pregnancy at 24+1 weeks with CHTN
here for growth evaluation
Interval review of the anatomy shows no sonographic
markers for aneuploidy or structural anomalies
All relevant fetal anatomy has been visualized
Amniotic fluid volume is normal
Estimated fetal weight shows growth in the 42nd percentile
Recommendations

Recommend follow-up ultrasound examination in 4 weeks for
growth evaluation
BP is elevated today 155/96 and 168/104. Will be seen in
[facility]inic [facility] in 1 hour

## 2019-05-08 IMAGING — US US MFM FETAL BPP W/O NON-STRESS
2 series · 12 of 23 positions shown · non-contrast
Comparison: none

[Series 1: us mfm fetal bpp w/o non-stress · 16 acquisitions, 8 frames shown (1 of 2)]
[im 1/16]
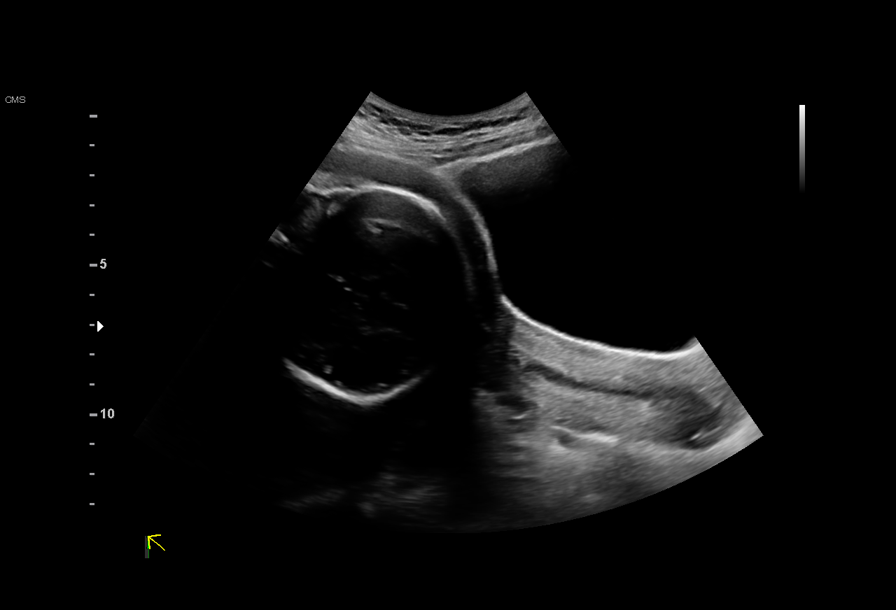
[im 3/16]
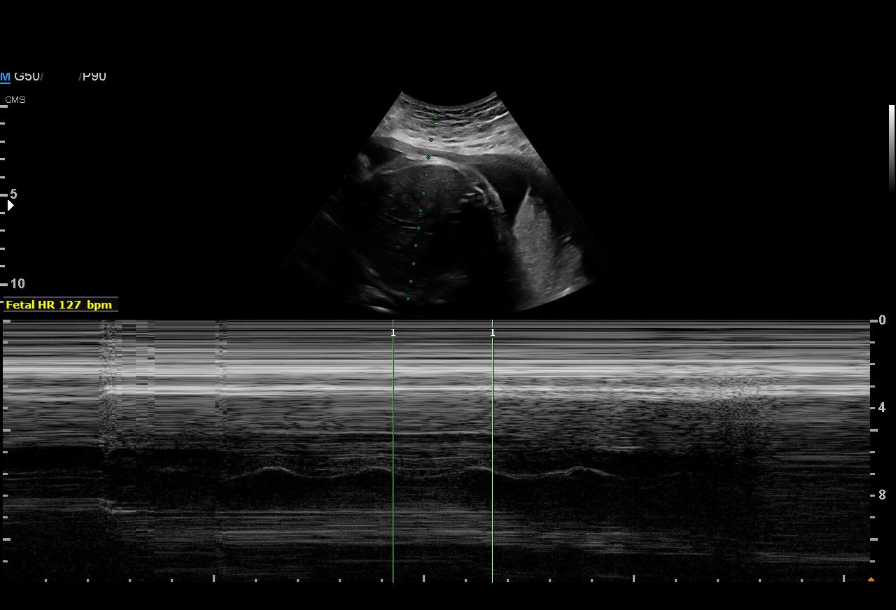
[im 5/16]
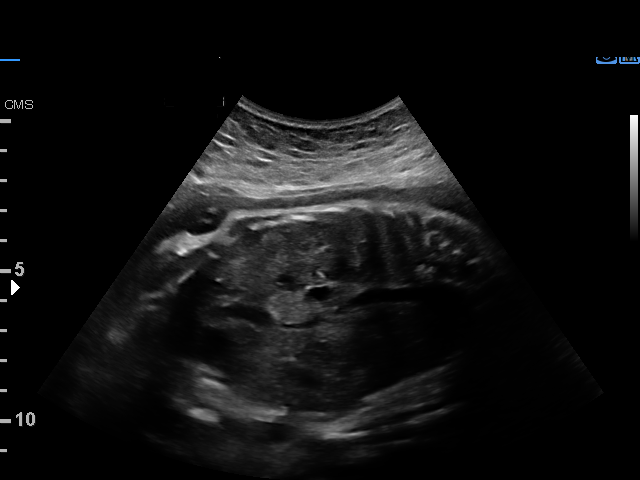
[im 7/16]
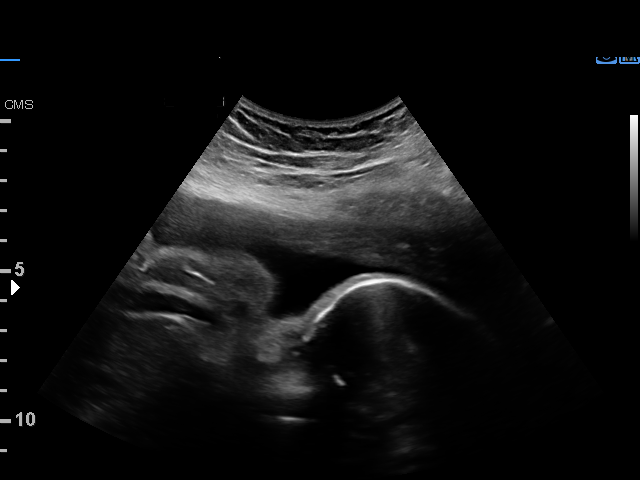
[im 9/16]
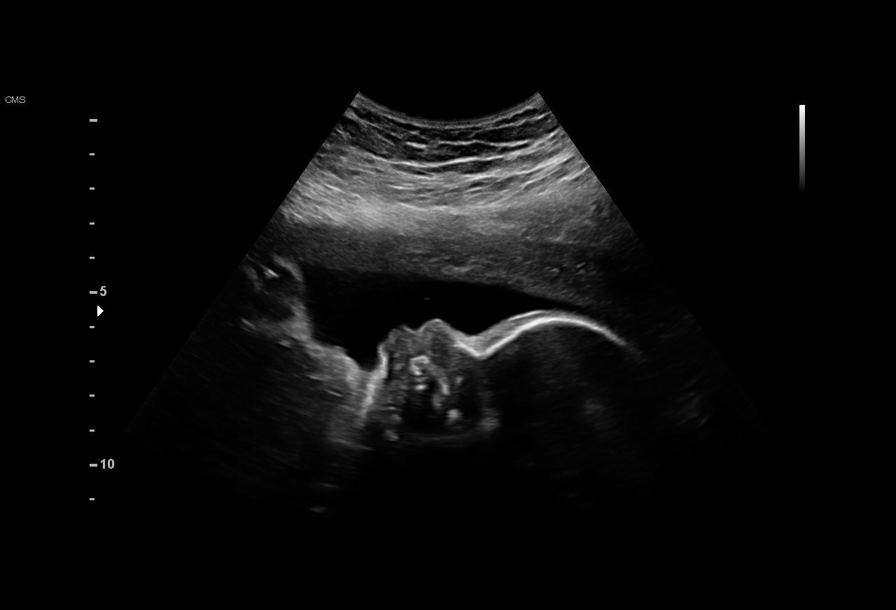
[im 11/16]
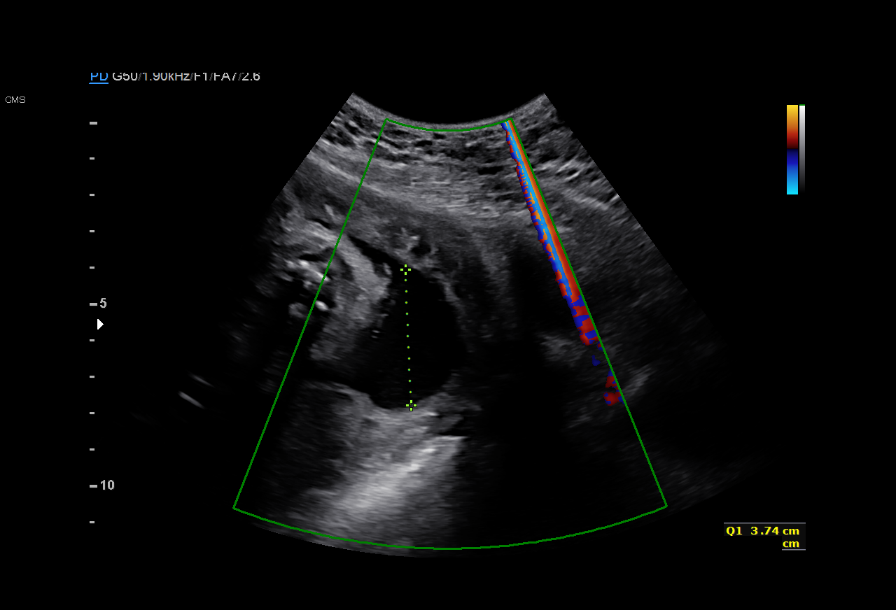
[im 13/16]
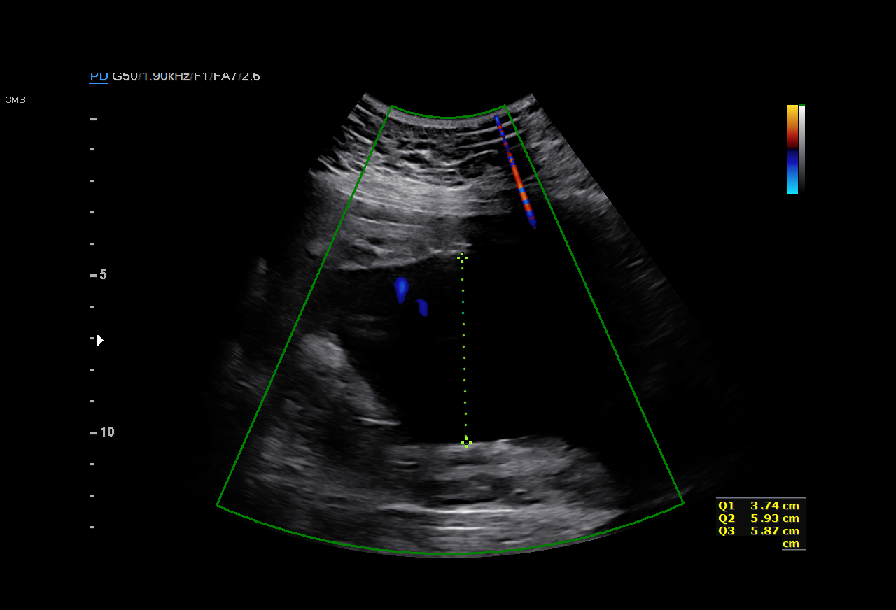
[im 15/16]
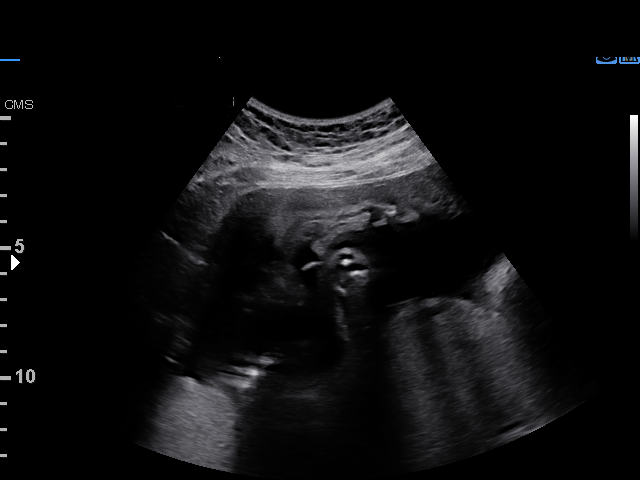

[Series 2: us mfm fetal bpp w/o non-stress · 7 acquisitions, 4 frames shown (2 of 2)]
[im 1/7]
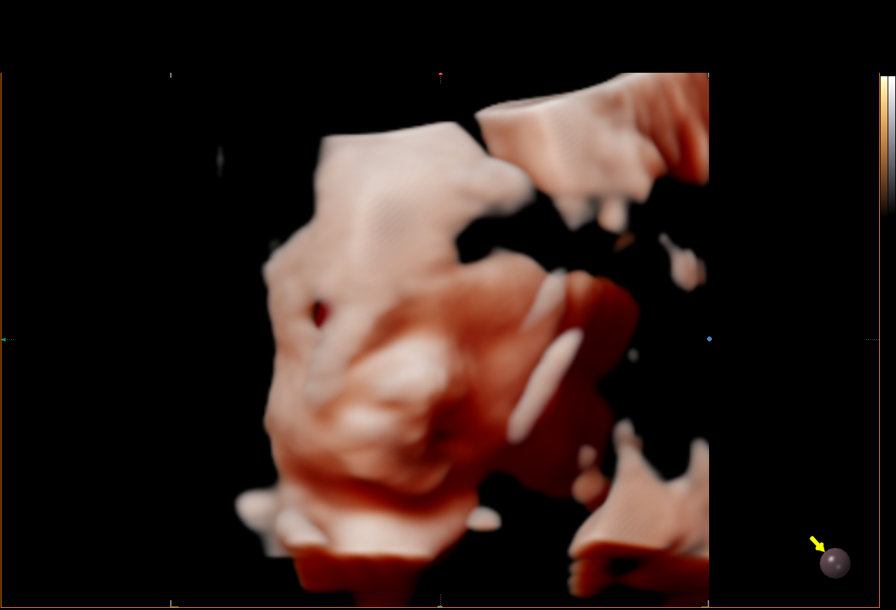
[im 3/7]
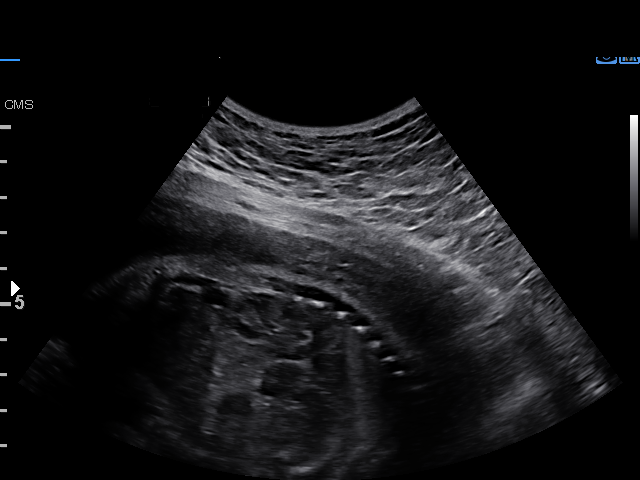
[im 5/7]
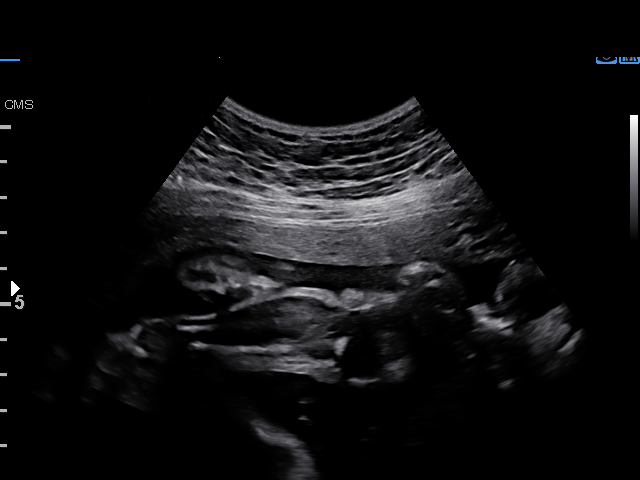
[im 7/7]
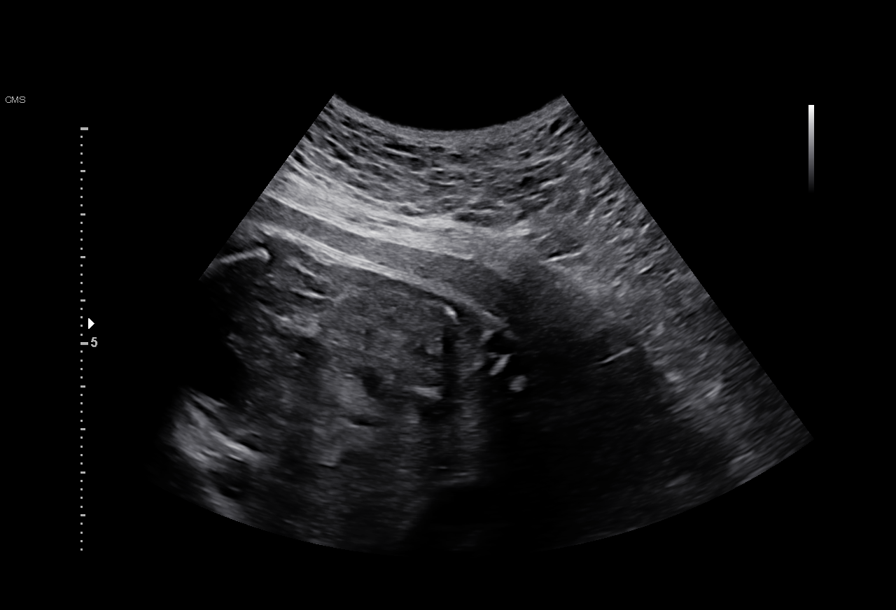

[12 of 23 positions shown; findings below may reference images not displayed]

[facility]

[facility] Clinic
Attending:        Juliann Everett         Secondary Phy.:   [facility]- [facility]
OB

1  EDYVAN ACKERMANN           [id number]      [id number]     [id number]
Indications

30 weeks gestation of pregnancy
Hypertension - Chronic with superimposed
preeclampsia
Fetal Evaluation

Num Of Fetuses:     1
Fetal Heart         127
Rate(bpm):
Cardiac Activity:   Observed
Presentation:       Cephalic

Amniotic Fluid
AFI FV:      Subjectively within normal limits

AFI Sum(cm)     %Tile       Largest Pocket(cm)
17.52           65

RUQ(cm)       RLQ(cm)       LUQ(cm)        LLQ(cm)
3.74
Biophysical Evaluation

Amniotic F.V:   Within normal limits       F. Tone:        Observed
F. Movement:    Observed                   Score:          [DATE]
F. Breathing:   Not Observed
Gestational Age

Best:          30w 3d     Det. By:  U/S C R L  (05/05/17)    EDD:   12/08/17
Impression

Intrauterine pregnancy at 30+3 weeks with CHTN and
superimosed preeclampsia
Normal amniotic fluid
BPP [DATE] with no sustained fetal breathing
Recommendations

Recommend EFM to obtain NST this AM

## 2024-01-21 ENCOUNTER — Emergency Department (HOSPITAL_COMMUNITY): Payer: MEDICAID

## 2024-01-21 ENCOUNTER — Other Ambulatory Visit: Payer: Self-pay

## 2024-01-21 ENCOUNTER — Inpatient Hospital Stay (HOSPITAL_COMMUNITY)
Admission: EM | Admit: 2024-01-21 | Discharge: 2024-01-24 | DRG: 065 | Disposition: A | Discharge status: Home / Self Care | Payer: MEDICAID | Attending: Internal Medicine | Admitting: Internal Medicine

## 2024-01-21 ENCOUNTER — Encounter (HOSPITAL_COMMUNITY): Payer: Self-pay

## 2024-01-21 DIAGNOSIS — Z6830 Body mass index (BMI) 30.0-30.9, adult: Secondary | ICD-10-CM

## 2024-01-21 DIAGNOSIS — I10 Essential (primary) hypertension: Secondary | ICD-10-CM | POA: Diagnosis present

## 2024-01-21 DIAGNOSIS — F419 Anxiety disorder, unspecified: Secondary | ICD-10-CM | POA: Diagnosis present

## 2024-01-21 DIAGNOSIS — R29702 NIHSS score 2: Secondary | ICD-10-CM | POA: Diagnosis not present

## 2024-01-21 DIAGNOSIS — I161 Hypertensive emergency: Secondary | ICD-10-CM | POA: Diagnosis present

## 2024-01-21 DIAGNOSIS — E66811 Obesity, class 1: Secondary | ICD-10-CM | POA: Diagnosis present

## 2024-01-21 DIAGNOSIS — Z880 Allergy status to penicillin: Secondary | ICD-10-CM

## 2024-01-21 DIAGNOSIS — Z720 Tobacco use: Secondary | ICD-10-CM

## 2024-01-21 DIAGNOSIS — I63512 Cerebral infarction due to unspecified occlusion or stenosis of left middle cerebral artery: Secondary | ICD-10-CM | POA: Diagnosis not present

## 2024-01-21 DIAGNOSIS — D509 Iron deficiency anemia, unspecified: Secondary | ICD-10-CM | POA: Diagnosis present

## 2024-01-21 DIAGNOSIS — Z8632 Personal history of gestational diabetes: Secondary | ICD-10-CM

## 2024-01-21 DIAGNOSIS — I160 Hypertensive urgency: Secondary | ICD-10-CM | POA: Diagnosis present

## 2024-01-21 DIAGNOSIS — Z716 Tobacco abuse counseling: Secondary | ICD-10-CM

## 2024-01-21 DIAGNOSIS — I639 Cerebral infarction, unspecified: Principal | ICD-10-CM | POA: Diagnosis present

## 2024-01-21 DIAGNOSIS — G8321 Monoplegia of upper limb affecting right dominant side: Secondary | ICD-10-CM | POA: Diagnosis present

## 2024-01-21 DIAGNOSIS — E785 Hyperlipidemia, unspecified: Secondary | ICD-10-CM | POA: Diagnosis present

## 2024-01-21 LAB — I-STAT CHEM 8, ED
BUN: 8 mg/dL (ref 6–20)
Calcium, Ion: 1.1 mmol/L — ABNORMAL LOW (ref 1.15–1.40)
Chloride: 105 mmol/L (ref 98–111)
Creatinine, Ser: 0.8 mg/dL (ref 0.44–1.00)
Glucose, Bld: 99 mg/dL (ref 70–99)
HCT: 33 % — ABNORMAL LOW (ref 36.0–46.0)
Hemoglobin: 11.2 g/dL — ABNORMAL LOW (ref 12.0–15.0)
Potassium: 3.6 mmol/L (ref 3.5–5.1)
Sodium: 138 mmol/L (ref 135–145)
TCO2: 23 mmol/L (ref 22–32)

## 2024-01-21 LAB — HEMOGLOBIN A1C
Hgb A1c MFr Bld: 5.4 % (ref 4.8–5.6)
Mean Plasma Glucose: 108.28 mg/dL

## 2024-01-21 LAB — BASIC METABOLIC PANEL WITH GFR
Anion gap: 12 (ref 5–15)
BUN: 9 mg/dL (ref 6–20)
CO2: 21 mmol/L — ABNORMAL LOW (ref 22–32)
Calcium: 8.9 mg/dL (ref 8.9–10.3)
Chloride: 102 mmol/L (ref 98–111)
Creatinine, Ser: 0.78 mg/dL (ref 0.44–1.00)
GFR, Estimated: >60 mL/min (ref >60)
Glucose, Bld: 99 mg/dL (ref 70–99)
Potassium: 3.5 mmol/L (ref 3.5–5.1)
Sodium: 135 mmol/L (ref 135–145)

## 2024-01-21 LAB — ANTITHROMBIN III: AntiThromb III Func: 103 % (ref 75–120)

## 2024-01-21 LAB — CBC WITH DIFFERENTIAL/PLATELET
Abs Immature Granulocytes: 0.02 K/uL (ref 0.00–0.07)
Basophils Absolute: 0.1 K/uL (ref 0.0–0.1)
Basophils Relative: 1 %
Eosinophils Absolute: 0.1 K/uL (ref 0.0–0.5)
Eosinophils Relative: 1 %
HCT: 32.1 % — ABNORMAL LOW (ref 36.0–46.0)
Hemoglobin: 9.7 g/dL — ABNORMAL LOW (ref 12.0–15.0)
Immature Granulocytes: 0 %
Lymphocytes Relative: 26 %
Lymphs Abs: 1.7 K/uL (ref 0.7–4.0)
MCH: 24.6 pg — ABNORMAL LOW (ref 26.0–34.0)
MCHC: 30.2 g/dL (ref 30.0–36.0)
MCV: 81.5 fL (ref 80.0–100.0)
Monocytes Absolute: 0.6 K/uL (ref 0.1–1.0)
Monocytes Relative: 9 %
Neutro Abs: 4.1 K/uL (ref 1.7–7.7)
Neutrophils Relative %: 63 %
Platelets: 418 K/uL — ABNORMAL HIGH (ref 150–400)
RBC: 3.94 MIL/uL (ref 3.87–5.11)
RDW: 17.2 % — ABNORMAL HIGH (ref 11.5–15.5)
WBC: 6.5 K/uL (ref 4.0–10.5)
nRBC: 0 % (ref 0.0–0.2)

## 2024-01-21 LAB — PREGNANCY, URINE: Preg Test, Ur: NEGATIVE

## 2024-01-21 LAB — URINALYSIS, W/ REFLEX TO CULTURE (INFECTION SUSPECTED)
Bacteria, UA: NONE SEEN
Bilirubin Urine: NEGATIVE
Glucose, UA: NEGATIVE mg/dL
Ketones, ur: 20 mg/dL — AB
Leukocytes,Ua: NEGATIVE
Nitrite: NEGATIVE
Protein, ur: 30 mg/dL — AB
Specific Gravity, Urine: 1.015 (ref 1.005–1.030)
pH: 7 (ref 5.0–8.0)

## 2024-01-21 LAB — MAGNESIUM: Magnesium: 1.9 mg/dL (ref 1.7–2.4)

## 2024-01-21 MED ORDER — LORAZEPAM 2 MG/ML IJ SOLN
1.0000 mg | Freq: Once | INTRAMUSCULAR | Status: AC
  Administered 2024-01-21: 1 mg via INTRAVENOUS
  Filled 2024-01-21: qty 1

## 2024-01-21 MED ORDER — HYDRALAZINE HCL 20 MG/ML IJ SOLN
10.0000 mg | Freq: Once | INTRAMUSCULAR | Status: AC
  Administered 2024-01-21: 10 mg via INTRAVENOUS
  Filled 2024-01-21: qty 1

## 2024-01-21 MED ORDER — CLOPIDOGREL BISULFATE 75 MG PO TABS
75.0000 mg | ORAL_TABLET | Freq: Every day | ORAL | Status: DC
  Administered 2024-01-22 – 2024-01-24 (×4): 75 mg via ORAL
  Filled 2024-01-21 (×4): qty 1

## 2024-01-21 MED ORDER — GADOBUTROL 1 MMOL/ML IV SOLN
8.0000 mL | Freq: Once | INTRAVENOUS | Status: AC | PRN
  Administered 2024-01-21: 8 mL via INTRAVENOUS

## 2024-01-21 MED ORDER — IOHEXOL 350 MG/ML SOLN
75.0000 mL | Freq: Once | INTRAVENOUS | Status: AC | PRN
  Administered 2024-01-21: 75 mL via INTRAVENOUS

## 2024-01-21 MED ORDER — STROKE: EARLY STAGES OF RECOVERY BOOK
Freq: Once | Status: AC
  Filled 2024-01-21: qty 1

## 2024-01-21 MED ORDER — ASPIRIN 81 MG PO TBEC
81.0000 mg | DELAYED_RELEASE_TABLET | Freq: Every day | ORAL | Status: DC
  Administered 2024-01-22 – 2024-01-24 (×4): 81 mg via ORAL
  Filled 2024-01-21 (×4): qty 1

## 2024-01-21 MED ORDER — LABETALOL HCL 5 MG/ML IV SOLN
20.0000 mg | Freq: Once | INTRAVENOUS | Status: AC
  Administered 2024-01-21: 20 mg via INTRAVENOUS
  Filled 2024-01-21: qty 4

## 2024-01-21 NOTE — Consult Note (Addendum)
 NEUROLOGY CONSULT NOTE   Date of service: January 21, 2024 Patient Name: Amanda Meza MRN:  [id number] DOB:  [birth year] Chief Complaint: right upper extremity weakness Requesting Provider: Towana Ozell BROCKS, MD  History of Present Illness  Amanda Meza is a 33 y.o. female with hx of pregnancy-induced hypertension not on any antihypertensives, gestational diabetes, anxiety who presents for evaluation of right hand weakness.  She reports that she has been having trouble with using her right hand now for almost a week.  She could not pinpoint the exact date or time when she noticed this difference.  Her blood pressures were also very high-she is not on any antihypertensive medications and has not had his blood pressure checked in a while. Her head CT reveals a subacute posterior left MCA distribution infarct for which neurological consultation was obtained. She denies prior history of stroke.  No family history of stroke in young.  LKW: 7 days ago Modified rankin score: 0-Completely asymptomatic and back to baseline post- stroke IV Thrombolysis: Outside the window EVT: Outside the window  NIHSS components Score: Comment  1a Level of Conscious 0[x]  1[]  2[]  3[]      1b LOC Questions 0[x]  1[]  2[]       1c LOC Commands 0[x]  1[]  2[]       2 Best Gaze 0[x]  1[]  2[]       3 Visual 0[x]  1[]  2[]  3[]      4 Facial Palsy 0[x]  1[]  2[]  3[]      5a Motor Arm - left 0[x]  1[]  2[]  3[]  4[]  UN[]    5b Motor Arm - Right 0[]  1[x]  2[]  3[]  4[]  UN[]    6a Motor Leg - Left 0[x]  1[]  2[]  3[]  4[]  UN[]    6b Motor Leg - Right 0[x]  1[]  2[]  3[]  4[]  UN[]    7 Limb Ataxia 0[x]  1[]  2[]  UN[]      8 Sensory 0[]  1[x]  2[]  UN[]      9 Best Language 0[x]  1[]  2[]  3[]      10 Dysarthria 0[x]  1[]  2[]  UN[]      11 Extinct. and Inattention 0[x]  1[]  2[]       TOTAL: 2      ROS  Comprehensive ROS performed and pertinent positives documented in HPI   Past History   Past Medical History:  Diagnosis Date   Anxiety     Gestational diabetes    Hypertension    Pregnancy induced hypertension     Past Surgical History:  Procedure Laterality Date   CESAREAN SECTION N/A 10/21/2017   Procedure: CESAREAN SECTION;  Surgeon: Barbra Lang PARAS, DO;  Location: [facility];  Service: Obstetrics;  Laterality: N/A;    Family History: History reviewed. No pertinent family history.  Social History Recently quit smoking.  Denies illicit drug use  Allergies  Allergen Reactions   Penicillins Rash    Has patient had a PCN reaction causing immediate rash, facial/tongue/throat swelling, SOB or lightheadedness with hypotension: Yes Has patient had a PCN reaction causing severe rash involving mucus membranes or skin necrosis: Yes Has patient had a PCN reaction that required hospitalization: No Has patient had a PCN reaction occurring within the last 10 years: No If all of the above answers are NO, then may proceed with Cephalosporin use.     Medications  No current facility-administered medications for this encounter.  Current Outpatient Medications:    ibuprofen  (ADVIL ,MOTRIN ) 600 MG tablet, Take 1 tablet (600 mg total) by mouth every 6 (six) hours., Disp: 30 tablet, Rfl: 0   labetalol  (NORMODYNE ) 200  MG tablet, TAKE TWO TABLETS BY MOUTH TWICE A DAY, Disp: 120 tablet, Rfl: 1   NIFEdipine  (PROCARDIA  XL/ADALAT -CC) 60 MG 24 hr tablet, TAKE ONE TABLET BY MOUTH TWICE A DAY, Disp: 60 tablet, Rfl: 1   NIFEdipine  (PROCARDIA -XL/ADALAT  CC) 60 MG 24 hr tablet, Take 1 tablet (60 mg total) by mouth 2 (two) times daily for 7 days. Until you can schedule follow up with office or your PCP., Disp: 14 tablet, Rfl: 0   NIFEdipine  (PROCARDIA -XL/NIFEDICAL-XL) 30 MG 24 hr tablet, Take 2 tablets (60 mg total) by mouth daily., Disp: 60 tablet, Rfl: 0   NIFEdipine  (PROCARDIA -XL/NIFEDICAL-XL) 30 MG 24 hr tablet, TAKE TWO TABLETS BY MOUTH DAILY, Disp: 60 tablet, Rfl: 0   NIFEdipine  (PROCARDIA -XL/NIFEDICAL-XL) 30 MG 24 hr tablet, TAKE TWO  TABLETS BY MOUTH DAILY, Disp: 60 tablet, Rfl: 0   NIFEdipine  (PROCARDIA -XL/NIFEDICAL-XL) 30 MG 24 hr tablet, TAKE TWO TABLETS BY MOUTH DAILY, Disp: 60 tablet, Rfl: 0   oxyCODONE  (OXY IR/ROXICODONE ) 5 MG immediate release tablet, Take 1-2 tablets (5-10 mg total) by mouth every 6 (six) hours as needed (pain scale 4-7)., Disp: 30 tablet, Rfl: 0  Vitals   Vitals:   01-27-2024 1900 01/27/2024 2100 2024/01/27 2110 2024/01/27 2120  BP: (!) 224/121 (!) 199/101 (!) 215/117 (!) 205/119  Pulse: 81 75 85 80  Resp: 15 (!) 9 20 17   Temp:      SpO2: 100% 100% 100% 100%  Weight:      Height:        Body mass index is 29.29 kg/m.   Physical Exam  General: Awake alert in no distress HEENT: Normocephalic atraumatic Lungs: Clear Cardiovascular: Regular rate rhythm Neurological exam Awake alert oriented x 3 No dysarthria No aphasia Cranial nerves II to XII intact Motor examination with mild right upper extremity weakness proximally and significant weakness with right grip strength and wrist flexion and extension.  Right lower extremity full strength.  Left upper and lower extremity full strength. Sensation exam reveals diminished sensation to light touch on the right upper extremity in comparison to the left.  Otherwise intact No dysmetria  Labs/Imaging/Neurodiagnostic studies   CBC:  Recent Labs  Lab 27-Jan-2024 1746 01-27-2024 1755  WBC 6.5  --   NEUTROABS 4.1  --   HGB 9.7* 11.2*  HCT 32.1* 33.0*  MCV 81.5  --   PLT 418*  --    Basic Metabolic Panel:  Lab Results  Component Value Date   NA 138 Jan 27, 2024   K 3.6 January 27, 2024   CO2 21 (L) 01/27/24   GLUCOSE 99 2024/01/27   BUN 8 01/27/2024   CREATININE 0.80 2024/01/27   CALCIUM  8.9 2024-01-27   GFRNONAA >60 January 27, 2024   GFRAA >60 10/24/2017   Imaging personally reviewed CT head reveals an acute to subacute appearing left posterior MCA distribution infarct.  ASSESSMENT   Amanda Meza is a 33 y.o. female past history of  gestational diabetes not on antihypertensives, has not had her blood pressure checked in a while, now presents for 7 days worth of right hand weakness.  On examination has subtle proximal right upper extremity weakness and significant distal right upper extremity weakness. CT head with acute to subacute appearing infarct in the left MCA posterior division distribution. Needs admission for further workup for stroke in young  Impression: Acute ischemic stroke involving the left MCA territory posterior division  RECOMMENDATIONS  Admit to hospitalist Frequent rechecks Telemetry CT angiography head and neck 2D echocardiogram Hemoglobin A1c Fasting lipid panel Therapy  assessments Aspirin  81+ Plavix  75 now 1 daily High intensity statin for LDL goal of less than 70 Hypercoagulable panel May need consideration for TEE and other tests going forward based on the above testing results and clinical course. Blood pressure goal-normotension.  Avoid hypotension.  No need for permissive hypertension as symptoms have been ongoing for 7 days Stroke team to follow Plan discussed with Dr. Towana ______________________________________________________________________    Signed, Eligio Lav, MD [facility]

## 2024-01-21 NOTE — Progress Notes (Signed)
 CT angiography head and neck completed and reviewed Acute/early subacute infarct in the posterior left MCA territory redemonstrated. There is a short segment occlusion of the proximal M1 segment of the left middle cerebral artery She is now at least 7 days and from her symptom onset-at this time I do not think there is a need to heparinize or any role for EVT. Avoidance of hypotension would be paramount. Stroke team to continue to follow  Eligio Lav, MD Neurology

## 2024-01-21 NOTE — ED Triage Notes (Signed)
 Pt here from home with c/o right hand pain that has been ongoing for approx 2 days

## 2024-01-21 NOTE — ED Provider Notes (Signed)
 [place] EMERGENCY DEPARTMENT AT [facility] Provider Note   CSN: [id number] Arrival date & time: 01/21/24  1609     Patient presents with: Hand Pain   Amanda Meza is a 33 y.o. female.  She is here with a complaint of difficulty using her right hand and numbness that has been going on for a few days.  She denies any trauma.  She is right-hand dominant.  She said she feels very anxious and she thinks her blood pressure is up.  She said she supposed to be on medicine for anxiety and blood pressure but has not taken them in a while.  She denies any chest pain shortness of breath headache vomiting diarrhea.  She has not tried anything for her symptoms.  She denies any chance of pregnancy.   The history is provided by the patient.  Hand Pain This is a new problem. The current episode started more than 2 days ago. The problem occurs constantly. The problem has not changed since onset.Pertinent negatives include no chest pain, no abdominal pain, no headaches and no shortness of breath. The symptoms are aggravated by bending. Nothing relieves the symptoms. She has tried rest for the symptoms. The treatment provided no relief.       Prior to Admission medications   Medication Sig Start Date End Date Taking? Authorizing Provider  ibuprofen  (ADVIL ,MOTRIN ) 600 MG tablet Take 1 tablet (600 mg total) by mouth every 6 (six) hours. 10/25/17   Eveline Lynwood MATSU, MD  labetalol  (NORMODYNE ) 200 MG tablet TAKE TWO TABLETS BY MOUTH TWICE A DAY 12/03/18   Eveline Lynwood MATSU, MD  NIFEdipine  (PROCARDIA  XL/ADALAT -CC) 60 MG 24 hr tablet TAKE ONE TABLET BY MOUTH TWICE A DAY 01/16/18   Edsel Norleen GAILS, MD  NIFEdipine  (PROCARDIA -XL/ADALAT  CC) 60 MG 24 hr tablet Take 1 tablet (60 mg total) by mouth 2 (two) times daily for 7 days. Until you can schedule follow up with office or your PCP. 01/16/18 01/23/18  Jerilynn Longs, NP  NIFEdipine  (PROCARDIA -XL/NIFEDICAL-XL) 30 MG 24 hr tablet Take 2 tablets (60 mg total)  by mouth daily. 04/26/18   Manus Colas, MD  NIFEdipine  (PROCARDIA -XL/NIFEDICAL-XL) 30 MG 24 hr tablet TAKE TWO TABLETS BY MOUTH DAILY 07/08/18   Eveline Lynwood MATSU, MD  NIFEdipine  (PROCARDIA -XL/NIFEDICAL-XL) 30 MG 24 hr tablet TAKE TWO TABLETS BY MOUTH DAILY 11/01/18   Eveline Lynwood MATSU, MD  NIFEdipine  (PROCARDIA -XL/NIFEDICAL-XL) 30 MG 24 hr tablet TAKE TWO TABLETS BY MOUTH DAILY 12/03/18   Eveline Lynwood MATSU, MD  oxyCODONE  (OXY IR/ROXICODONE ) 5 MG immediate release tablet Take 1-2 tablets (5-10 mg total) by mouth every 6 (six) hours as needed (pain scale 4-7). 10/25/17   Eveline Lynwood MATSU, MD    Allergies: Penicillins    Review of Systems  Constitutional:  Negative for fever.  Eyes:  Negative for visual disturbance.  Respiratory:  Negative for shortness of breath.   Cardiovascular:  Negative for chest pain.  Gastrointestinal:  Negative for abdominal pain.  Musculoskeletal:  Negative for neck pain.  Neurological:  Positive for weakness and numbness. Negative for speech difficulty and headaches.    Updated Vital Signs BP (!) 233/129   Pulse (!) 128   Temp 98.3 F (36.8 C)   Resp 16   Ht 5' 5 (1.651 m)   Wt 79.8 kg   SpO2 100%   BMI 29.29 kg/m   Physical Exam Vitals and nursing note reviewed.  Constitutional:      General: She is not in  acute distress.    Appearance: Normal appearance. She is well-developed.  HENT:     Head: Normocephalic and atraumatic.  Eyes:     Conjunctiva/sclera: Conjunctivae normal.  Cardiovascular:     Rate and Rhythm: Regular rhythm. Tachycardia present.     Heart sounds: No murmur heard. Pulmonary:     Effort: Pulmonary effort is normal. No respiratory distress.     Breath sounds: Normal breath sounds. No stridor. No wheezing.  Abdominal:     Palpations: Abdomen is soft.     Tenderness: There is no abdominal tenderness. There is no guarding or rebound.  Musculoskeletal:        General: No tenderness or deformity. Normal range of motion.     Cervical  back: Neck supple.  Skin:    General: Skin is warm and dry.  Neurological:     General: No focal deficit present.     Mental Status: She is alert and oriented to person, place, and time.     GCS: GCS eye subscore is 4. GCS verbal subscore is 5. GCS motor subscore is 6.     Cranial Nerves: No cranial nerve deficit.     Sensory: Sensory deficit present.     Motor: Weakness present.     Gait: Gait normal.     Comments: She has subjective decrease sensation at the tips of her right index and thumb.  Otherwise has normal sensation.  She feels that she has limited ability to make a fist.  She is able to move her digits however     (all labs ordered are listed, but only abnormal results are displayed) Labs Reviewed  I-STAT CHEM 8, ED - Abnormal; Notable for the following components:      Result Value   Calcium , Ion 1.10 (*)    Hemoglobin 11.2 (*)    HCT 33.0 (*)    All other components within normal limits  CBC WITH DIFFERENTIAL/PLATELET  BASIC METABOLIC PANEL WITH GFR  MAGNESIUM     EKG: EKG Interpretation Date/Time:  Thursday January 21 2024 17:11:13 EDT Ventricular Rate:  111 PR Interval:  136 QRS Duration:  90 QT Interval:  338 QTC Calculation: 459 R Axis:   63  Text Interpretation: Sinus tachycardia Otherwise normal ECG When compared with ECG of 20-Oct-2017 15:31, rate is increased Confirmed by Towana Sharper [phone]) on 01/21/2024 6:10:51 PM  Radiology: No results found.   .Critical Care  Performed by: Towana Sharper BROCKS, MD Authorized by: Towana Sharper BROCKS, MD   Critical care provider statement:    Critical care time (minutes):  45   Critical care time was exclusive of:  Separately billable procedures and treating other patients   Critical care was necessary to treat or prevent imminent or life-threatening deterioration of the following conditions:  Circulatory failure and CNS failure or compromise   Critical care was time spent personally by me on the following  activities:  Development of treatment plan with patient or surrogate, discussions with consultants, evaluation of patient's response to treatment, examination of patient, obtaining history from patient or surrogate, ordering and performing treatments and interventions, ordering and review of laboratory studies, ordering and review of radiographic studies, pulse oximetry and re-evaluation of patient's condition   I assumed direction of critical care for this patient from another provider in my specialty: no      Medications Ordered in the ED   stroke: early stages of recovery book (has no administration in time range)  aspirin  EC tablet 81 mg (  has no administration in time range)  clopidogrel  (PLAVIX ) tablet 75 mg (has no administration in time range)  labetalol  (NORMODYNE ) injection 20 mg (20 mg Intravenous Given 01/21/24 1854)  LORazepam  (ATIVAN ) injection 1 mg (1 mg Intravenous Given 01/21/24 1854)  hydrALAZINE  (APRESOLINE ) injection 10 mg (10 mg Intravenous Given 01/21/24 2119)  LORazepam  (ATIVAN ) injection 1 mg (1 mg Intravenous Given 01/21/24 2208)  iohexol  (OMNIPAQUE ) 350 MG/ML injection 75 mL (75 mLs Intravenous Contrast Given 01/21/24 2239)  gadobutrol  (GADAVIST ) 1 MMOL/ML injection 8 mL (8 mLs Intravenous Contrast Given 01/21/24 2324)    Clinical Course as of 01/21/24 2335  Thu Jan 21, 2024  1819 Patient not in room yet.  Triage timer stating patient has been waiting in room for ED provider for a 90 minutes which is not true. [MB]  2051 Reassessed patient.  Blood pressure remains elevated although tachycardia better.  Still having difficulty using her right hand. [MB]  2136 Received a call from radiology that the patient has an acute posterior stroke.  Reviewed with neurology Dr. Voncile.  He is recommending medical admission, MRI with and without.  Permissive hypertension at this time, treat if above 220 [MB]  2151 Discussed with [facility] hospitalist Dr. Arthea [MB]    Clinical Course User  Index [MB] Towana Ozell BROCKS, MD                                 Medical Decision Making Amount and/or Complexity of Data Reviewed Labs: ordered. Radiology: ordered.  Risk Prescription drug management. Decision regarding hospitalization.   This patient complains of right hand weakness; this involves an extensive number of treatment Options and is a complaint that carries with it a high risk of complications and morbidity. The differential includes stroke, radiculopathy, contracture, spasm, metabolic derangement  I ordered, reviewed and interpreted labs, which included CBC with chronically low hemoglobin, chemistries unremarkable, pregnancy negative, urinalysis with some proteinuria I ordered medication IV blood pressure medication and reviewed PMP when indicated. I ordered imaging studies which included CT head, MRI brain and I independently    visualized and interpreted imaging which showed acute stroke Additional history obtained from patient's family members Previous records obtained and reviewed in epic no recent admissions I consulted neurology Dr. Voncile and [facility] hospitalist Dr. Arthea and discussed lab and imaging findings and discussed disposition.  Cardiac monitoring reviewed, sinus tachycardia improvement in normal sinus rhythm Social determinants considered, no significant barriers Critical Interventions: Initiation of IV blood control for hypertensive urgency  After the interventions stated above, I reevaluated the patient and found patient still to be with right hand deficits, blood pressure somewhat trending down Admission and further testing considered, she would benefit from mission for further management of blood pressure and further neurologic workup.  She is in agreement plan for admission.      Final diagnoses:  Acute CVA (cerebrovascular accident) [facility])  Hypertensive urgency    ED Discharge Orders     None          Towana Ozell BROCKS,  MD 01/21/24 2338

## 2024-01-21 NOTE — ED Provider Triage Note (Signed)
 Emergency Medicine Provider Triage Evaluation Note  Amanda Meza , a 33 y.o. female  was evaluated in triage.  Pt complains of right hand pain.  There is some temperature change on the right hand compared to the left.  She is right-hand dominant.  Blood pressure significantly elevated.  She has been told she has hypertension in the past but does not take any medicine.  No vision change, balance issues, chest pain, or shortness of breath.  Review of Systems  Positive: As above Negative: As above  Physical Exam  BP (!) 239/128 (BP Location: Left Arm)   Pulse (!) 128   Temp 98.3 F (36.8 C)   Resp 18   Ht 5' 5 (1.651 m)   Wt 79.8 kg   SpO2 100%   BMI 29.29 kg/m  Gen:   Awake, no distress   Resp:  Normal effort  MSK:   Moves extremities without difficulty  Other:    Medical Decision Making  Medically screening exam initiated at 5:22 PM.  Appropriate orders placed.  Amanda Meza was informed that the remainder of the evaluation will be completed by another provider, this initial triage assessment does not replace that evaluation, and the importance of remaining in the ED until their evaluation is complete.  She is significantly hypertensive.  She is tachycardic.  She is prioritized for a room.   Hildegard Loge, PA-C 01/21/24 1723

## 2024-01-21 NOTE — ED Triage Notes (Signed)
 Reports pain in right wrist and hand and fingers are cold and rigid.  Reports she can't make a fist.

## 2024-01-22 ENCOUNTER — Inpatient Hospital Stay (HOSPITAL_COMMUNITY): Payer: MEDICAID

## 2024-01-22 DIAGNOSIS — I10 Essential (primary) hypertension: Secondary | ICD-10-CM

## 2024-01-22 DIAGNOSIS — I63512 Cerebral infarction due to unspecified occlusion or stenosis of left middle cerebral artery: Principal | ICD-10-CM

## 2024-01-22 DIAGNOSIS — I6389 Other cerebral infarction: Secondary | ICD-10-CM | POA: Diagnosis not present

## 2024-01-22 DIAGNOSIS — I160 Hypertensive urgency: Secondary | ICD-10-CM

## 2024-01-22 DIAGNOSIS — I639 Cerebral infarction, unspecified: Secondary | ICD-10-CM

## 2024-01-22 DIAGNOSIS — Z716 Tobacco abuse counseling: Secondary | ICD-10-CM | POA: Diagnosis not present

## 2024-01-22 DIAGNOSIS — G8321 Monoplegia of upper limb affecting right dominant side: Secondary | ICD-10-CM | POA: Diagnosis present

## 2024-01-22 DIAGNOSIS — F419 Anxiety disorder, unspecified: Secondary | ICD-10-CM | POA: Diagnosis present

## 2024-01-22 DIAGNOSIS — E785 Hyperlipidemia, unspecified: Secondary | ICD-10-CM | POA: Diagnosis present

## 2024-01-22 DIAGNOSIS — D509 Iron deficiency anemia, unspecified: Secondary | ICD-10-CM | POA: Diagnosis present

## 2024-01-22 DIAGNOSIS — I779 Disorder of arteries and arterioles, unspecified: Secondary | ICD-10-CM

## 2024-01-22 DIAGNOSIS — R29701 NIHSS score 1: Secondary | ICD-10-CM | POA: Diagnosis not present

## 2024-01-22 DIAGNOSIS — E66811 Obesity, class 1: Secondary | ICD-10-CM | POA: Diagnosis present

## 2024-01-22 DIAGNOSIS — Z6830 Body mass index (BMI) 30.0-30.9, adult: Secondary | ICD-10-CM | POA: Diagnosis not present

## 2024-01-22 DIAGNOSIS — Z87891 Personal history of nicotine dependence: Secondary | ICD-10-CM

## 2024-01-22 DIAGNOSIS — I161 Hypertensive emergency: Secondary | ICD-10-CM | POA: Diagnosis present

## 2024-01-22 DIAGNOSIS — Z880 Allergy status to penicillin: Secondary | ICD-10-CM | POA: Diagnosis not present

## 2024-01-22 DIAGNOSIS — Z720 Tobacco use: Secondary | ICD-10-CM | POA: Diagnosis not present

## 2024-01-22 DIAGNOSIS — Z8632 Personal history of gestational diabetes: Secondary | ICD-10-CM | POA: Diagnosis not present

## 2024-01-22 LAB — ECHOCARDIOGRAM COMPLETE
AR max vel: 2.86 cm2
AV Area VTI: 2.62 cm2
AV Area mean vel: 2.55 cm2
AV Mean grad: 5 mmHg
AV Peak grad: 11.2 mmHg
Ao pk vel: 1.67 m/s
Area-P 1/2: 6.02 cm2
Calc EF: 59.2 %
Height: 64 in
MV VTI: 3.48 cm2
S' Lateral: 3.3 cm
Single Plane A2C EF: 63 %
Single Plane A4C EF: 57.8 %
Weight: 2816 [oz_av]

## 2024-01-22 LAB — LIPID PANEL
Cholesterol: 205 mg/dL — ABNORMAL HIGH (ref 0–200)
HDL: 63 mg/dL (ref >40)
LDL Cholesterol: 131 mg/dL — ABNORMAL HIGH (ref 0–99)
Total CHOL/HDL Ratio: 3.3 ratio
Triglycerides: 54 mg/dL (ref <150)
VLDL: 11 mg/dL (ref 0–40)

## 2024-01-22 LAB — CBC
HCT: 29.6 % — ABNORMAL LOW (ref 36.0–46.0)
Hemoglobin: 9.3 g/dL — ABNORMAL LOW (ref 12.0–15.0)
MCH: 24.6 pg — ABNORMAL LOW (ref 26.0–34.0)
MCHC: 31.4 g/dL (ref 30.0–36.0)
MCV: 78.3 fL — ABNORMAL LOW (ref 80.0–100.0)
Platelets: 364 K/uL (ref 150–400)
RBC: 3.78 MIL/uL — ABNORMAL LOW (ref 3.87–5.11)
RDW: 17.4 % — ABNORMAL HIGH (ref 11.5–15.5)
WBC: 6.3 K/uL (ref 4.0–10.5)
nRBC: 0 % (ref 0.0–0.2)

## 2024-01-22 LAB — BASIC METABOLIC PANEL WITH GFR
Anion gap: 11 (ref 5–15)
BUN: 8 mg/dL (ref 6–20)
CO2: 23 mmol/L (ref 22–32)
Calcium: 8.8 mg/dL — ABNORMAL LOW (ref 8.9–10.3)
Chloride: 101 mmol/L (ref 98–111)
Creatinine, Ser: 0.71 mg/dL (ref 0.44–1.00)
GFR, Estimated: >60 mL/min (ref >60)
Glucose, Bld: 114 mg/dL — ABNORMAL HIGH (ref 70–99)
Potassium: 3.3 mmol/L — ABNORMAL LOW (ref 3.5–5.1)
Sodium: 135 mmol/L (ref 135–145)

## 2024-01-22 LAB — VITAMIN B12: Vitamin B-12: 219 pg/mL (ref 180–914)

## 2024-01-22 LAB — TSH: TSH: 1.253 u[IU]/mL (ref 0.350–4.500)

## 2024-01-22 LAB — RAPID URINE DRUG SCREEN, HOSP PERFORMED
Amphetamines: NOT DETECTED
Barbiturates: NOT DETECTED
Benzodiazepines: NOT DETECTED
Cocaine: NOT DETECTED
Opiates: NOT DETECTED
Tetrahydrocannabinol: NOT DETECTED

## 2024-01-22 LAB — HIV ANTIBODY (ROUTINE TESTING W REFLEX): HIV Screen 4th Generation wRfx: NONREACTIVE

## 2024-01-22 MED ORDER — ONDANSETRON HCL 4 MG PO TABS
4.0000 mg | ORAL_TABLET | Freq: Four times a day (QID) | ORAL | Status: DC | PRN
Start: 2024-01-22 — End: 2024-01-24

## 2024-01-22 MED ORDER — SORBITOL 70 % SOLN: 30.0000 mL | Freq: Every day | Status: DC | PRN

## 2024-01-22 MED ORDER — ONDANSETRON HCL 4 MG/2ML IJ SOLN: 4.0000 mg | Freq: Four times a day (QID) | INTRAMUSCULAR | Status: DC | PRN

## 2024-01-22 MED ORDER — AMLODIPINE BESYLATE 5 MG PO TABS: 10.0000 mg | ORAL_TABLET | Freq: Every day | ORAL | Status: DC

## 2024-01-22 MED ORDER — ENOXAPARIN SODIUM 40 MG/0.4ML IJ SOSY
40.0000 mg | PREFILLED_SYRINGE | INTRAMUSCULAR | Status: DC
  Administered 2024-01-22 – 2024-01-24 (×3): 40 mg via SUBCUTANEOUS
  Filled 2024-01-22 (×3): qty 0.4

## 2024-01-22 MED ORDER — ROSUVASTATIN CALCIUM 20 MG PO TABS
20.0000 mg | ORAL_TABLET | Freq: Every day | ORAL | Status: DC
  Administered 2024-01-22 – 2024-01-24 (×3): 20 mg via ORAL
  Filled 2024-01-22 (×3): qty 1

## 2024-01-22 MED ORDER — AMLODIPINE BESYLATE 5 MG PO TABS
5.0000 mg | ORAL_TABLET | Freq: Once | ORAL | Status: AC
  Administered 2024-01-22: 5 mg via ORAL

## 2024-01-22 MED ORDER — DIAZEPAM 5 MG/ML IJ SOLN
5.0000 mg | Freq: Four times a day (QID) | INTRAMUSCULAR | Status: DC | PRN
  Administered 2024-01-22 – 2024-01-23 (×3): 5 mg via INTRAVENOUS
  Filled 2024-01-22 (×3): qty 2

## 2024-01-22 MED ORDER — AMLODIPINE BESYLATE 5 MG PO TABS
5.0000 mg | ORAL_TABLET | Freq: Every day | ORAL | Status: DC
  Administered 2024-01-22: 5 mg via ORAL
  Filled 2024-01-22: qty 1

## 2024-01-22 MED ORDER — AMLODIPINE BESYLATE 5 MG PO TABS
10.0000 mg | ORAL_TABLET | Freq: Every day | ORAL | Status: DC
  Filled 2024-01-22: qty 2

## 2024-01-22 MED ORDER — HYDRALAZINE HCL 20 MG/ML IJ SOLN
20.0000 mg | INTRAMUSCULAR | Status: DC | PRN
  Administered 2024-01-23: 20 mg via INTRAVENOUS
  Filled 2024-01-22: qty 1

## 2024-01-22 MED ORDER — LACTATED RINGERS IV BOLUS: 500.0000 mL | Freq: Once | INTRAVENOUS | Status: DC

## 2024-01-22 MED ORDER — ACETAMINOPHEN 325 MG PO TABS: 650.0000 mg | ORAL_TABLET | Freq: Four times a day (QID) | ORAL | Status: DC | PRN

## 2024-01-22 MED ORDER — LOSARTAN POTASSIUM 50 MG PO TABS
50.0000 mg | ORAL_TABLET | Freq: Every day | ORAL | Status: DC
  Administered 2024-01-22: 50 mg via ORAL
  Filled 2024-01-22: qty 1

## 2024-01-22 MED ORDER — HYDRALAZINE HCL 20 MG/ML IJ SOLN
10.0000 mg | Freq: Four times a day (QID) | INTRAMUSCULAR | Status: DC | PRN
  Administered 2024-01-22 (×2): 10 mg via INTRAVENOUS
  Filled 2024-01-22 (×2): qty 1

## 2024-01-22 MED ORDER — ACETAMINOPHEN 650 MG RE SUPP: 650.0000 mg | Freq: Four times a day (QID) | RECTAL | Status: DC | PRN

## 2024-01-22 NOTE — Evaluation (Signed)
 Speech Language Pathology Evaluation Patient Details Name: Amanda Meza MRN: [id number] DOB: 04-13-1991 Today's Date: 01/22/2024 Time: 9059-8983 SLP Time Calculation (min) (ACUTE ONLY): 36 min  Problem List:  Patient Active Problem List   Diagnosis Date Noted   Acute CVA (cerebrovascular accident) (HCC) 01/22/2024   HTN (hypertension) 01/22/2024   Chronic hypertension with superimposed preeclampsia 10/20/2017   Gestational diabetes 09/21/2017   Chronic hypertension with superimposed pre-eclampsia 09/21/2017   Supervision of high risk pregnancy, antepartum 07/01/2017   Chronic hypertension during pregnancy, antepartum 07/01/2017   Past Medical History:  Past Medical History:  Diagnosis Date   Anxiety    Gestational diabetes    Hypertension    Pregnancy induced hypertension    Past Surgical History:  Past Surgical History:  Procedure Laterality Date   CESAREAN SECTION N/A 10/21/2017   Procedure: CESAREAN SECTION;  Surgeon: Barbra Lang PARAS, DO;  Location: [facility];  Service: Obstetrics;  Laterality: N/A;   HPI:  Amanda Meza is a 33 y.o. female with no known medical history is sent in by her coworkers because of difficulty moving her right hand. MRI 9/4: Moderately large area of acute ischemia within the posterior left MCA territory, with associated petechial blood products but no hemorrhagic transformation.   Assessment / Plan / Recommendation Clinical Impression  Pt presents with mild-moderate cognitive linguistic deficits including decreased attention, impaired short term recall and difficulty with executive function tasks, including calculation and reasoning.  Pt was assessed using the COGNISTAT (see below for additional information).  Pt performed within the average range on all subtests administered except for attention, repetition (see speech-language comments), memory, and judgment.  Visuospatial construction could not be assessed 2/2 RUE  impairments.  Pt did not attempt to write with her non-dominant hand despite suggestion that this may be more efficient.  Pt performed within the average range on orientation, but should could not name the year (20... I can't remember) and she reported the month was the 9th month. She was able to state September when asked what the 9th month is called.  Pt is young with no prior med hx, she has a 4 yo child and is working. She would benefit from ST to address the above noted deficits.  Recommend ongoing ST at next level of care.  I think she would be an excellent CIR candidate if she qualifies.    Pt's speech is clear and without dysarthria, but some articulation errors were noted (ex: aktapjus for octopus) which were rare and seemingly consistent with apraxia of speech.  Pt without any obvious word finding deficits in conversation, but was noted to struggle during similarities task (It's like it's right on the tip of my tongue). On command following task she performed within the average range during which she was observed to self cue by repeating directions aloud.  She was unable to complete 3 step directions.  This is suspected to be related to inattention. Repetition errors during testing are suspected to be r/t attention deficits and possible apraxic errors rather than a true language impairment.  COGNISTAT: All subtests are within the average range, except where otherwise specified.  Orientation:  10/12 Attention: 4/8, Mild-Moderate Impairment Comprehension: 5/6 Repetition: 8/12, Mild-Moderate Impairment Naming: 8/8 Construction: unable to assess Memory: 5/12, Moderate-Severe Impairment Calculations: 2/4, Mild Impairment Similarities: 6/8 Judgment: 3/6, Mild Impairment      SLP Assessment  SLP Recommendation/Assessment: Patient needs continued Speech Language Pathology Services SLP Visit Diagnosis: Cognitive communication deficit (R41.841)  Assistance Recommended at  Discharge  Intermittent Supervision/Assistance  Functional Status Assessment Patient has had a recent decline in their functional status and demonstrates the ability to make significant improvements in function in a reasonable and predictable amount of time.  Frequency and Duration min 2x/week  2 weeks      SLP Evaluation Cognition  Overall Cognitive Status: Impaired/Different from baseline Arousal/Alertness: Awake/alert Orientation Level: Oriented X4 Year:  (I don't remember) Month: September (9th month) Day of Week: Correct Attention: Focused;Sustained Focused Attention: Impaired Focused Attention Impairment: Verbal basic Sustained Attention: Impaired Sustained Attention Impairment: Verbal basic Memory: Impaired Memory Impairment: Decreased short term memory Awareness: Appears intact Problem Solving: Impaired Problem Solving Impairment: Verbal basic Executive Function: Reasoning Reasoning: Appears intact       Comprehension  Auditory Comprehension Overall Auditory Comprehension: Appears within functional limits for tasks assessed Commands: Within Functional Limits Conversation: Complex Visual Recognition/Discrimination Discrimination: Not tested Reading Comprehension Reading Status: Not tested    Expression Expression Primary Mode of Expression: Verbal Verbal Expression Overall Verbal Expression: Appears within functional limits for tasks assessed Initiation: No impairment Level of Generative/Spontaneous Verbalization: Conversation Repetition: Impaired Level of Impairment: Sentence level (apraxia v attn?) Naming: No impairment Pragmatics: Impairment Impairments: Monotone Written Expression Dominant Hand: Right Written Expression: Unable to assess (comment) (RUE impairment)   Oral / Motor  Motor Speech Overall Motor Speech: Appears within functional limits for tasks assessed Respiration: Within functional limits Phonation: Normal Resonance: Within  functional limits Articulation: Within functional limitis Intelligibility: Intelligible Motor Planning: Within functional limits Motor Speech Errors: Not applicable            Amanda FORBES Grippe, MA, CCC-SLP Acute Rehabilitation Services Office: [phone] 01/22/2024, 10:25 AM

## 2024-01-22 NOTE — H&P (Addendum)
 History and Physical    Patient: Amanda Meza FMW:[id number] DOB: 1991/04/16 DOA: 01/21/2024 DOS: the patient was seen and examined on 01/22/2024 PCP: Patient, No Pcp Per  Patient coming from: Home  Chief Complaint:  Chief Complaint  Patient presents with   Hand Pain   HPI: Amanda Meza is a 33 y.o. female with no known medical history is sent in by her coworkers because of difficulty moving her right hand.  The patient apparently had been having weeks of pain in her right forearm.  Thought it was because of lifting heavy boxes or possibly some carpal tunnel type overuse injury.  Her last episode of pain was approximately 3 days ago.  Though she had not does not have pain anymore she did start having trouble moving her right hand her right fingers.   She was not able to make a fist . This persisted for at least 3 days when her coworkers advised her to seek medical attention. The patient had been diagnosed with high blood pressure during her pregnancy 6 years ago.  She had to take blood pressure medications during her pregnancy.  She has not had any follow-up since that time and is no longer taking any medication. On arrival in the emergency department patient was found to have an elevated blood pressure 233/129.  She did have a CT and MRI which revealed an acute left MCA infarct.  Neurology was consulted and the patient will be admitted by the hospitalist service.    Review of Systems: As mentioned in the history of present illness. All other systems reviewed and are negative. Past Medical History:  Diagnosis Date   Anxiety    Gestational diabetes    Hypertension    Pregnancy induced hypertension    Past Surgical History:  Procedure Laterality Date   CESAREAN SECTION N/A 10/21/2017   Procedure: CESAREAN SECTION;  Surgeon: Barbra Lang PARAS, DO;  Location: [facility];  Service: Obstetrics;  Laterality: N/A;   Social History:  reports that she has never smoked. She  has never used smokeless tobacco. She reports that she does not drink alcohol and does not use drugs.  Allergies  Allergen Reactions   Penicillins Rash    Has patient had a PCN reaction causing immediate rash, facial/tongue/throat swelling, SOB or lightheadedness with hypotension: Yes Has patient had a PCN reaction causing severe rash involving mucus membranes or skin necrosis: Yes Has patient had a PCN reaction that required hospitalization: No Has patient had a PCN reaction occurring within the last 10 years: No If all of the above answers are NO, then may proceed with Cephalosporin use.     History reviewed. No pertinent family history.  Prior to Admission medications   Not on File  No meds prior to admission.  Physical Exam: Vitals:   01/21/24 2130 01/21/24 2200 01/22/24 0021 01/22/24 0040  BP: (!) 194/111 (!) 207/111  (!) 202/116  Pulse: 91 100 90 99  Resp: 14 12 15 17   Temp:   98.9 F (37.2 C)   TempSrc:   Oral   SpO2: 100% 100% 100% 100%  Weight:      Height:       Physical Exam:  General: No acute distress, well developed, well nourished, yawning  HEENT: Normocephalic, atraumatic, PERRL Cardiovascular: Normal rate and rhythm. Distal pulses intact. Pulmonary: Normal pulmonary effort, normal breath sounds Gastrointestinal: Nondistended abdomen, soft, non-tender, normoactive bowel sounds Musculoskeletal:Normal ROM, no lower ext edema Skin: Skin is warm and dry.  Neuro: AAOx3 right hand has some weakness in finger squeeze and is clumsy and slow to move PSYCH: cooperative but very slow to respond to questions and very slow to follow commands  Data Reviewed:  Results for orders placed or performed during the hospital encounter of 01/21/24 (from the past 24 hours)  CBC with Differential     Status: Abnormal   Collection Time: 01/21/24  5:46 PM  Result Value Ref Range   WBC 6.5 4.0 - 10.5 K/uL   RBC 3.94 3.87 - 5.11 MIL/uL   Hemoglobin 9.7 (L) 12.0 - 15.0 g/dL    HCT 67.8 (L) 63.9 - 46.0 %   MCV 81.5 80.0 - 100.0 fL   MCH 24.6 (L) 26.0 - 34.0 pg   MCHC 30.2 30.0 - 36.0 g/dL   RDW 82.7 (H) 88.4 - 84.4 %   Platelets 418 (H) 150 - 400 K/uL   nRBC 0.0 0.0 - 0.2 %   Neutrophils Relative % 63 %   Neutro Abs 4.1 1.7 - 7.7 K/uL   Lymphocytes Relative 26 %   Lymphs Abs 1.7 0.7 - 4.0 K/uL   Monocytes Relative 9 %   Monocytes Absolute 0.6 0.1 - 1.0 K/uL   Eosinophils Relative 1 %   Eosinophils Absolute 0.1 0.0 - 0.5 K/uL   Basophils Relative 1 %   Basophils Absolute 0.1 0.0 - 0.1 K/uL   Immature Granulocytes 0 %   Abs Immature Granulocytes 0.02 0.00 - 0.07 K/uL  Basic metabolic panel     Status: Abnormal   Collection Time: 01/21/24  5:46 PM  Result Value Ref Range   Sodium 135 135 - 145 mmol/L   Potassium 3.5 3.5 - 5.1 mmol/L   Chloride 102 98 - 111 mmol/L   CO2 21 (L) 22 - 32 mmol/L   Glucose, Bld 99 70 - 99 mg/dL   BUN 9 6 - 20 mg/dL   Creatinine, Ser 9.21 0.44 - 1.00 mg/dL   Calcium  8.9 8.9 - 10.3 mg/dL   GFR, Estimated >39 >39 mL/min   Anion gap 12 5 - 15  Magnesium      Status: None   Collection Time: 01/21/24  5:46 PM  Result Value Ref Range   Magnesium  1.9 1.7 - 2.4 mg/dL  I-stat chem 8, ED (not at [facility], [facility] or [facility])     Status: Abnormal   Collection Time: 01/21/24  5:55 PM  Result Value Ref Range   Sodium 138 135 - 145 mmol/L   Potassium 3.6 3.5 - 5.1 mmol/L   Chloride 105 98 - 111 mmol/L   BUN 8 6 - 20 mg/dL   Creatinine, Ser 9.19 0.44 - 1.00 mg/dL   Glucose, Bld 99 70 - 99 mg/dL   Calcium , Ion 1.10 (L) 1.15 - 1.40 mmol/L   TCO2 23 22 - 32 mmol/L   Hemoglobin 11.2 (L) 12.0 - 15.0 g/dL   HCT 66.9 (L) 63.9 - 53.9 %  Pregnancy, urine     Status: None   Collection Time: 01/21/24  6:43 PM  Result Value Ref Range   Preg Test, Ur NEGATIVE NEGATIVE  Urinalysis, w/ Reflex to Culture (Infection Suspected) -Urine, Clean Catch     Status: Abnormal   Collection Time: 01/21/24  6:43 PM  Result Value Ref Range   Specimen Source URINE,  CLEAN CATCH    Color, Urine YELLOW YELLOW   APPearance CLEAR CLEAR   Specific Gravity, Urine 1.015 1.005 - 1.030   pH 7.0 5.0 - 8.0  Glucose, UA NEGATIVE NEGATIVE mg/dL   Hgb urine dipstick MODERATE (A) NEGATIVE   Bilirubin Urine NEGATIVE NEGATIVE   Ketones, ur 20 (A) NEGATIVE mg/dL   Protein, ur 30 (A) NEGATIVE mg/dL   Nitrite NEGATIVE NEGATIVE   Leukocytes,Ua NEGATIVE NEGATIVE   RBC / HPF 0-5 0 - 5 RBC/hpf   WBC, UA 0-5 0 - 5 WBC/hpf   Bacteria, UA NONE SEEN NONE SEEN   Squamous Epithelial / HPF 0-5 0 - 5 /HPF   Mucus PRESENT   Hemoglobin A1c     Status: None   Collection Time: 01/21/24 10:59 PM  Result Value Ref Range   Hgb A1c MFr Bld 5.4 4.8 - 5.6 %   Mean Plasma Glucose 108.28 mg/dL  Antithrombin III      Status: None   Collection Time: 01/21/24 10:59 PM  Result Value Ref Range   AntiThromb III Func 103 75 - 120 %     Assessment and Plan: Hypertension, uncontrolled 2. Acute left MCA CVA.  Appreciate neurology recommendations as follows:  Admit to hospitalist Frequent rechecks Telemetry CT angiography head and neck 2D echocardiogram Hemoglobin A1c Fasting lipid panel Therapy assessments Aspirin  81+ Plavix  75 now 1 daily High intensity statin for LDL goal of less than 70 Hypercoagulable panel May need consideration for TEE and other tests going forward based on the above testing results and clinical course. Blood pressure goal-normotension.  Avoid hypotension.  No need for permissive hypertension as symptoms have been ongoing for 7 days Stroke team to follow  - Losartan  and Norvasc  started for htn. - Hydralazine  prn. Importance of avoiding hypotension noted. - I spent some time with the patient and her parents going over the hypertension and stroke diagnosis and the fact that the patient will be discharged on multiple medications that she will need to take. - Urine drug screen still pending - The patient was very slow to follow commands. Baseline cognitive  delay?   Advance Care Planning:   Code Status: Full Code CODE STATUS discussion was deferred.  The patient seemed very overwhelmed about everything.  She will be full code by default  Consults: Neurology  Family Communication: Mom and dad were at bedside  Severity of Illness: The appropriate patient status for this patient is INPATIENT. Inpatient status is judged to be reasonable and necessary in order to provide the required intensity of service to ensure the patient's safety. The patient's presenting symptoms, physical exam findings, and initial radiographic and laboratory data in the context of their chronic comorbidities is felt to place them at high risk for further clinical deterioration. Furthermore, it is not anticipated that the patient will be medically stable for discharge from the hospital within 2 midnights of admission.   * I certify that at the point of admission it is my clinical judgment that the patient will require inpatient hospital care spanning beyond 2 midnights from the point of admission due to high intensity of service, high risk for further deterioration and high frequency of surveillance required.*  Author: ARTHEA CHILD, MD 01/22/2024 1:05 AM  For on call review [email or web address].

## 2024-01-22 NOTE — Progress Notes (Signed)
 [facility] night cross cover note:   I was notified by the patient's RN  that the patient is complaining of some anxiety. I subsequently added order for prn iv valium  for anxiety for her.      Eva Pore, DO Hospitalist

## 2024-01-22 NOTE — Evaluation (Signed)
 Occupational Therapy Evaluation Patient Details Name: Amanda Meza MRN: [id number] DOB: Jan 28, 1991 Today's Date: 01/22/2024   History of Present Illness   33 y.o. female presents to [facility] 01/21/24 due to having difficulty moving right hand. MRI brain revealed acute L MCA infarct. PMHx: HTN, anxiety     Clinical Impressions Pt admitted with there above diagnosis and has the deficits outlined below. Pt would benefit from cont OT to increase functional use of RUE during all adls. Pt with RUE weakness and sensory loss making functional use difficult. Pt also with poor vision and reports that her glasses have been broken for sometime now and she has very poor acuity.  Can social work help with this in any way?  Pt with mild confusion this am not recalling the date and becoming upset after MD left stating she very overwhelmed.  Pt lives with daugther's, dad's parents.  Pt states one parent can be available if needed. Ideally pt would benefit from OPOT for RUE rehab and cognitive rehab.  Will continue to see.     If plan is discharge home, recommend the following:   Assistance with cooking/housework;A little help with bathing/dressing/bathroom;Supervision due to cognitive status     Functional Status Assessment   Patient has had a recent decline in their functional status and demonstrates the ability to make significant improvements in function in a reasonable and predictable amount of time.     Equipment Recommendations   None recommended by OT     Recommendations for Other Services         Precautions/Restrictions   Precautions Precautions: None Restrictions Weight Bearing Restrictions Per Provider Order: No     Mobility Bed Mobility Overal bed mobility: Needs Assistance Bed Mobility: Supine to Sit, Sit to Supine     Supine to sit: Independent Sit to supine: Independent   General bed mobility comments: no assist needed    Transfers Overall transfer level:  Independent Equipment used: None               General transfer comment: no assist needed      Balance Overall balance assessment: No apparent balance deficits (not formally assessed)                                         ADL either performed or assessed with clinical judgement   ADL Overall ADL's : Needs assistance/impaired Eating/Feeding: Set up;Sitting   Grooming: Standing;Supervision/safety   Upper Body Bathing: Set up;Sitting Upper Body Bathing Details (indicate cue type and reason): extra time needed. Lower Body Bathing: Set up;Sit to/from stand;Cueing for compensatory techniques Lower Body Bathing Details (indicate cue type and reason): cues to use RUE as much as possible. Upper Body Dressing : Set up;Sitting Upper Body Dressing Details (indicate cue type and reason): assist with fasteners Lower Body Dressing: Set up;Sit to/from stand;Cueing for compensatory techniques Lower Body Dressing Details (indicate cue type and reason): assist for zippers Toilet Transfer: Supervision/safety   Toileting- Clothing Manipulation and Hygiene: Supervision/safety       Functional mobility during ADLs: Modified independent General ADL Comments: Pt requires assist with tasks that involve the weak RUE. Pt requires cues to use RUE functionally     Vision Baseline Vision/History: 1 Wears glasses Ability to See in Adequate Light: 2 Moderately impaired (glasses broken) Patient Visual Report: No change from baseline Vision Assessment?: Yes Eye Alignment: Within Functional Limits  Ocular Range of Motion: Within Functional Limits Alignment/Gaze Preference: Within Defined Limits Tracking/Visual Pursuits: Able to track stimulus in all quads without difficulty Convergence: Within functional limits Visual Fields: No apparent deficits Additional Comments: Pt unable to read clock on wall. May be due to clock not being digital or because pt does not have glasses with her.  Pt has very poor acuity per report. Pt could read digitial watch.     Perception         Praxis Praxis: WFL       Pertinent Vitals/Pain Pain Assessment Pain Assessment: No/denies pain Faces Pain Scale: No hurt     Extremity/Trunk Assessment Upper Extremity Assessment Upper Extremity Assessment: Right hand dominant;RUE deficits/detail RUE Deficits / Details: ROM WFL. Strength: shoulder 3+/5, biceps 4/5, triceps 3+/5, wrist flex/ex 3+/5, hand flexion 4/5, extension 3+/5 RUE Sensation: decreased light touch;decreased proprioception RUE Coordination: decreased fine motor;decreased gross motor   Lower Extremity Assessment Lower Extremity Assessment: Defer to PT evaluation   Cervical / Trunk Assessment Cervical / Trunk Assessment: Normal   Communication Communication Communication: No apparent difficulties   Cognition Arousal: Alert Behavior During Therapy: Flat affect Cognition: No family/caregiver present to determine baseline, Cognition impaired   Orientation impairments: Time, Situation Awareness: Online awareness impaired Memory impairment (select all impairments): Short-term memory Attention impairment (select first level of impairment): Sustained attention Executive functioning impairment (select all impairments): Reasoning, Problem solving OT - Cognition Comments: pt with memory deficits, inability to recall date but did with a 5 minute delay. Pt became very teary at end of session and is very overwhelmed.                 Following commands: Intact       Cueing  General Comments   Cueing Techniques: Verbal cues;Tactile cues  Pt with limitations involving RUE and cognition.   Exercises     Shoulder Instructions      Home Living Family/patient expects to be discharged to:: Private residence Living Arrangements: Children Available Help at Discharge: Family;Available PRN/intermittently Type of Home: Apartment Home Access: Stairs to enter Entrance  Stairs-Number of Steps: 6 Entrance Stairs-Rails: Left Home Layout: One level     Bathroom Shower/Tub: Chief Strategy Officer: Standard     Home Equipment: None   Additional Comments: Lives with daughter, daughters grandfather/grandmother on dad's side      Prior Functioning/Environment Prior Level of Function : Independent/Modified Independent;Working/employed             Mobility Comments: ind ADLs Comments: ind, uses public transportation for mobility, works at [employer] List: Decreased strength;Impaired vision/perception;Decreased cognition;Decreased coordination;Impaired UE functional use;Impaired sensation   OT Treatment/Interventions: Self-care/ADL training;Therapeutic exercise;Therapeutic activities;Cognitive remediation/compensation      OT Goals(Current goals can be found in the care plan section)   Acute Rehab OT Goals Patient Stated Goal: to get better OT Goal Formulation: With patient Time For Goal Achievement: 02/05/24 Potential to Achieve Goals: Good ADL Goals Additional ADL Goal #1: Pt will be independent with RUE HEP for fine motor and strength Additional ADL Goal #2: Pt will use RUE to assist in all adl activities with min cues. Additional ADL Goal #3: Pt will complete weight bearing tasks during adls with RUE to increase fuctional use and sensation in RUE   OT Frequency:  Min 3X/week    Co-evaluation              AM-PAC OT 6 Clicks Daily  Activity     Outcome Measure Help from another person eating meals?: A Little Help from another person taking care of personal grooming?: A Little Help from another person toileting, which includes using toliet, bedpan, or urinal?: A Little Help from another person bathing (including washing, rinsing, drying)?: A Little Help from another person to put on and taking off regular upper body clothing?: A Little Help from another person to put on and taking off  regular lower body clothing?: A Little 6 Click Score: 18   End of Session Nurse Communication: Mobility status  Activity Tolerance: Patient tolerated treatment well Patient left: in bed;with call bell/phone within reach  OT Visit Diagnosis: Muscle weakness (generalized) (M62.81);Other symptoms and signs involving cognitive function;Hemiplegia and hemiparesis Hemiplegia - Right/Left: Right Hemiplegia - dominant/non-dominant: Dominant Hemiplegia - caused by: Cerebral infarction                Time: 1026-1100 OT Time Calculation (min): 34 min Charges:  OT General Charges $OT Visit: 1 Visit OT Evaluation $OT Eval Moderate Complexity: 1 Mod OT Treatments $Self Care/Home Management : 8-22 mins  Joshua Silvano Dragon 01/22/2024, 11:16 AM

## 2024-01-22 NOTE — Progress Notes (Addendum)
 STROKE TEAM PROGRESS NOTE    SIGNIFICANT HOSPITAL EVENTS 9/2: admitted with acute left MCA infarct and HTN  INTERIM HISTORY/SUBJECTIVE Patient presented to ED with complaint of right hand pain and found to have BP of 233/129 on admission.  She has been on antihypertensives during her pregnancy but does not currently take anything for this.  Patient is slow to respond and follow commands, and has flat affect, unsure what baseline mentation is.  Of note, patient uses Amanda Meza to get around, so maybe some ID at baseline.  OBJECTIVE  CBC    Component Value Date/Time   WBC 6.3 01/22/2024 0554   RBC 3.78 (L) 01/22/2024 0554   HGB 9.3 (L) 01/22/2024 0554   HGB 11.4 09/17/2017 0840   HCT 29.6 (L) 01/22/2024 0554   HCT 35.0 09/17/2017 0840   PLT 364 01/22/2024 0554   PLT 314 09/17/2017 0840   MCV 78.3 (L) 01/22/2024 0554   MCV 93 09/17/2017 0840   MCH 24.6 (L) 01/22/2024 0554   MCHC 31.4 01/22/2024 0554   RDW 17.4 (H) 01/22/2024 0554   RDW 13.1 09/17/2017 0840   LYMPHSABS 1.7 01/21/2024 1746   LYMPHSABS 2.3 07/01/2017 1136   MONOABS 0.6 01/21/2024 1746   EOSABS 0.1 01/21/2024 1746   EOSABS 0.1 07/01/2017 1136   BASOSABS 0.1 01/21/2024 1746   BASOSABS 0.0 07/01/2017 1136    BMET    Component Value Date/Time   NA 135 01/22/2024 0554   NA 140 07/01/2017 1136   K 3.3 (L) 01/22/2024 0554   CL 101 01/22/2024 0554   CO2 23 01/22/2024 0554   GLUCOSE 114 (H) 01/22/2024 0554   BUN 8 01/22/2024 0554   BUN 6 07/01/2017 1136   CREATININE 0.71 01/22/2024 0554   CALCIUM  8.8 (L) 01/22/2024 0554   GFRNONAA >60 01/22/2024 0554    IMAGING past 24 hours MR Brain W and Wo Contrast Result Date: 01/22/2024 EXAM: MRI BRAIN WITH AND WITHOUT CONTRAST 01/21/2024 11:35:00 PM TECHNIQUE: Multiplanar multisequence MRI of the head/brain was performed with and without the administration of intravenous contrast. COMPARISON: None available. CLINICAL HISTORY: Neuro deficit, acute, stroke suspected. FINDINGS:  BRAIN AND VENTRICLES: Moderately large area of acute ischemia within the posterior left MCA territory. Small areas of ischemia within the left basal ganglia and left cerebral peduncle are also noted. Petechial blood products within the infarcted territory but no hemorrhagic transformation. Multifocal hyperintense T2-weighted signal within the cerebral white matter, most commonly due to chronic small vessel disease. There is leptomeningeal contrast enhancement within the infarcted territory which is an expected finding in the setting of subacute ischemia. No mass effect or midline shift. No hydrocephalus. Normal flow voids. No mass. ORBITS: No acute abnormality. SINUSES: No acute abnormality. BONES AND SOFT TISSUES: Normal bone marrow signal and enhancement. No acute soft tissue abnormality. IMPRESSION: 1. Moderately large area of acute ischemia within the posterior left MCA territory, with associated petechial blood products but no hemorrhagic transformation. 2. Leptomeningeal contrast enhancement within the infarcted territory, an expected finding in the setting of subacute ischemia. 3. Background multifocal hyperintense T2-weighted signal within the cerebral white matter, most commonly due to chronic small vessel disease. Electronically signed by: Franky Stanford MD 01/22/2024 12:02 AM EDT RP Workstation: [id number]   CT ANGIO HEAD NECK W WO CM Result Date: 01/21/2024 EXAM: CTA Head and Neck without and with Intravenous Contrast. CT Head without Contrast. CLINICAL HISTORY: Stroke/TIA, determine embolic source. CT ANGIO HEAD NECK W WO CM; Stroke/TIA, determine embolic source TECHNIQUE: Axial CTA  images of the head and neck performed with intravenous contrast. MIP reconstructed images were created and reviewed. Axial computed tomography images of the head/brain performed without intravenous contrast. Note: Per PQRS, the description of internal carotid artery percent stenosis, including 0 percent or normal exam, is  based on [place] American Symptomatic Carotid Endarterectomy Trial (NASCET) criteria. Dose reduction technique was used including one or more of the following: automated exposure control, adjustment of mA and kV according to patient size, and/or iterative reconstruction. CONTRAST: 75mL (iohexol  (OMNIPAQUE ) 350 MG/ML injection 75 mL IOHEXOL  350 MG/ML SOLN) COMPARISON: None provided. FINDINGS: CT HEAD: BRAIN: Acute / early subacute infarct within the posterior left MCA territory. VENTRICLES: No hydrocephalus. ORBITS: The orbits are unremarkable. SINUSES AND MASTOIDS: The paranasal sinuses and mastoid air cells are clear. CTA NECK: COMMON CAROTID ARTERIES: No significant stenosis. No dissection or occlusion. INTERNAL CAROTID ARTERIES: No stenosis by NASCET criteria. No dissection or occlusion. VERTEBRAL ARTERIES: No significant stenosis. No dissection or occlusion. CTA HEAD: ANTERIOR CEREBRAL ARTERIES: No significant stenosis. No occlusion. No aneurysm. MIDDLE CEREBRAL ARTERIES: There is a short segment (4mm) occlusion of the proximal M1 segment of the left middle cerebral artery. POSTERIOR CEREBRAL ARTERIES: No significant stenosis. No occlusion. No aneurysm. BASILAR ARTERY: No significant stenosis. No occlusion. No aneurysm. OTHER: SOFT TISSUES: No acute finding. No masses or lymphadenopathy. BONES: No acute osseous abnormality. IMPRESSION: 1. Acute/early subacute infarct within the posterior left MCA territory. 2. Short segment occlusion of the proximal M1 segment of the left middle cerebral artery. 3. Findings communicated to Dr. Ashish Arora at 10:48 pm 01/21/2024 Electronically signed by: Franky Stanford MD 01/21/2024 10:50 PM EDT RP Workstation: [id number]   CT Head Wo Contrast Result Date: 01/21/2024 CLINICAL DATA:  Neuro deficit, acute, stroke suspected RUE weakness EXAM: CT HEAD WITHOUT CONTRAST TECHNIQUE: Contiguous axial images were obtained from the base of the skull through the vertex without intravenous  contrast. RADIATION DOSE REDUCTION: This exam was performed according to the departmental dose-optimization program which includes automated exposure control, adjustment of the mA and/or kV according to patient size and/or use of iterative reconstruction technique. COMPARISON:  None Available. FINDINGS: Brain: Acute loss of gray-white matter differentiation centered along the left parietal region extending to the left frontal, occipital, temporal lobes. No parenchymal hemorrhage. No mass lesion. No extra-axial collection. Edema along the infarction with loss of sulci. No midline shift. No hydrocephalus. Basilar cisterns are patent. Vascular: No hyperdense vessel. Skull: No acute fracture or focal lesion. Sinuses/Orbits: Paranasal sinuses and mastoid air cells are clear. The orbits are unremarkable. Other: None. IMPRESSION: 1. Acute left posterior MCA distrubution infarction. 2. No acute intracranial hemorrhage. These results were called by telephone at the time of interpretation on 01/21/2024 at 9:22 pm to provider [facility] , who verbally acknowledged these results. Electronically Signed   By: Morgane  Naveau M.D.   On: 01/21/2024 21:24    Vitals:   01/22/24 0229 01/22/24 0309 01/22/24 0400 01/22/24 0821  BP: (!) 194/107 (!) 147/86 118/68 134/82  Pulse:   72 93  Resp:   16 16  Temp:   98.7 F (37.1 C) 98.7 F (37.1 C)  TempSrc:   Oral Oral  SpO2:   100% 100%  Weight:      Height:        PHYSICAL EXAM General:  Alert, well-nourished, well-developed patient in no acute distress CV: RRR on monitor Psych:  flat affect, slow processing speed but not sure if this is baseline Respiratory: Normal work of  breathing on room air  NEURO:  Mental Status: AA&Ox3, patient is able to give clear and coherent history Speech/Language: speech is slowed and some memory trouble with recent events but without dysphagia.  Naming, repetition, fluency, and comprehension intact.  Cranial Nerves:  II: PERRL.  Visual fields full, difficulty with acuity, not wearing glasses III, IV, VI: EOMI. Eyelids elevate symmetrically.  V: Sensation is intact to light touch and symmetrical to face.  VII: Face is symmetrical resting and smiling VIII: hearing intact to voice. IX, X: Palate elevates symmetrically. Phonation is normal.  KP:Dynloizm shrug 5/5. XII: tongue is midline without fasciculations. Motor: 5/5 strength to LLE, RLE, LUE and RUE Tone: is normal and bulk is normal Sensation- Intact to light touch bilaterally.  Coordination: FTN intact bilaterally without ataxia, difficultly with rapid alternating movements on R hand Gait- deferred  Most Recent NIH 1 @ 1140 9/5   ASSESSMENT/PLAN  Ms. Amanda Meza is a 33 y.o. female with history of gestational hypertension and diabetes admitted for stroke.  Her neuroexam was unremarkable except for difficulty with visual acuity likely 2/2 not having her glasses, and difficulty with dexterity of her right hand.  NIH on Admission 2  Stroke: Left MCA scattered patchy infarct as well as left midbrain and inferior thalamus infarct, with high grade left M1 proximal stenosis, etiology uncertain, large vessel disease vs. Embolic source. Of note she does have risk factors including smoking and uncontrolled HTN CT head showed acute left posterior MCA distrubution infarction   CTA head & neck with short segment high grade stenosis of the proximal M1 segment of the left middle cerebral artery MRI shows moderately large area of acute ischemia within the posterior left MCA territory, with associated petechial blood products but no hemorrhagic transformation  2D Echo EF 65-70%, no regional wall motion abnormalities Outpt TEE will be arranged TCD bubble study no PFO LE venous doppler no DVT Hypercoagulability panel pending UDS - negative LDL 131 HgbA1c 5.4 VTE prophylaxis -Lovenox  No antithrombotic prior to admission, now on aspirin  81 mg daily and clopidogrel   75 mg daily for 3 months and then ASA alone. Therapy recommendations:  Outpatient PT/OT/ST Disposition:  pending  Hypertensive urgency  Home meds:  none Significantly hypertensive to 233/129 on admission Still high BP currenlty Hydralazine  10 mg IV every 6 hours as needed Losartan  50 mg daily Amlodipine  5 mg daily Gradually normalizes BP in 2-3 days BP goal of normotension  Hyperlipidemia Home meds: None LDL 131, goal < 70 Add Crestor  20 mg daily Continue statin at discharge  Tobacco abuse Recently quit smoking Continued smoking cessation counseling provided  Other Stroke Risk Factors Obesity, Body mass index is 30.21 kg/m., BMI >/= 30 associated with increased stroke risk, recommend weight loss, diet and exercise as appropriate   Other Active Problems Anemia - microcytic anemia   Hospital day # 0    ATTENDING NOTE: I reviewed above note and agree with the assessment and plan. Pt was seen and examined.   OT at the bedside. Pt sitting at the edge of bed, flat affect, AAO x 3, psychomotor slowing but otherwise intact except right hand dexterity mild difficulty. Stroke etiology not quite clear, she did have hypertensive urgency, uncontrolled at home with smoking, and her R MCA proximal high grade stenosis. Could be large vessel disease with uncontrolled risk factors but also could be embolic pattern. So far TCD bubble and LE venous Doppler negative.  TTE unremarkable, hypercoagulable workup pending.  Will need outpatient TEE.  PT and OT recommend outpatient.  LDL 131, put on Crestor  20.  Continue DAPT for 3 months given large vessel high-grade stenosis and then aspirin  alone.  For detailed assessment and plan, please refer to above as I have made changes wherever appropriate.   Neurology will sign off. Please call with questions. Pt will follow up with stroke clinic NP at [facility] in about 4 weeks. Thanks for the consult.  Ary Cummins, MD PhD Stroke Neurology 01/22/2024 4:46  PM    To contact Stroke Continuity provider, please refer to [email or web address]. After hours, contact General Neurology

## 2024-01-22 NOTE — Progress Notes (Signed)
 Same day note  Amanda Meza is a 33 y.o. female with history of hypertension presented to hospital with difficulty moving her right hand 4 weeks with some pain.  She initially thought it could be secondary to lifting heavy boxes and carpal tunnel syndrome due to overuse injury.  Patient does not have current pain but has been having trouble moving her fingers and her coworkers advised her to seek medical attention.  In the ED patient was noted to have elevated blood pressure of 233/129.  She did have a CT and MRI which revealed an acute left MCA infarct.  Neurology was consulted and patient was admitted hospital for further evaluation and treatment.  Patient seen and examined at bedside.  Patient was admitted to the hospital for hand pain  At the time of my evaluation, patient complains of weakness on the extremity  Physical examination reveals right upper extremity weakness than the lower extremity.  Communicative.  Slightly obese.  Laboratory data and imaging was reviewed  Assessment and Plan.  Essential hypertension, uncontrolled Goal normotension since symptoms been going on for last 7 days.  Continue losartan  and Norvasc  and hydralazine  as needed.  Latest blood pressure 134/82.  Acute left MCA CVA.  CT head scan was positive for left posterior MCA distribution infarct.  MRI of the brain shows moderately large area of ischemic stroke in the posterior left MCA territory with associated petechial blood products.  Contrast enhancement in the leptomeningeal area in the infarcted territory.  Neurology has seen the patient.  Continue stroke workup.  CTA of the head and neck with short segment occlusion of the proximal M1 segment of the left middle cerebral artery.  2D echocardiogram pending.  Aspirin  and Plavix  for now.  Continue statins.  LDL goal less than 70.  Blood pressure goal is normotension since symptoms been going on for 7 days.  Follow neurology recommendations.  Class I  obesity.  Body mass index is 30.21 kg/m.  Spoke about lifestyle modification and intervention to reduce weight.    No Charge  Signed,  Vernal Anselm Alstrom, MD [facility]

## 2024-01-22 NOTE — Progress Notes (Signed)
 VASCULAR LAB    Bilateral lower extremity venous duplex has been performed.  See CV proc for preliminary results.   Leondra Cullin, RVT 01/22/2024, 3:21 PM

## 2024-01-22 NOTE — Progress Notes (Signed)
 [facility] night cross cover note:   I was notified by the patient's RN of the patient's elevated systolic blood pressures in the 190s to low 200s mmHg. no new complaints at this time.  Regarding this patient was hospitalized with acute ischemic CVA, I reviewed the most recent neurology progress note, and it does not appear that we are observing permissive hypertension at this time.  Rather, neurology recommends gradual normalization of blood pressure over the next 2 to 3 days, with neurology recommendations that include continued prn IV hydralazine  for systolic blood pressure  greater than 190 mmHg as well as continuation of scheduled amlodipine  and scheduled losartan .   The patient currently has orders for hydralazine  10 mg IV every 6 hours as needed for systolic blood pressure greater than 190 mmHg.  Additionally, she has orders for amlodipine  5 mg p.o. daily as well as losartan  50 mg p.o. daily.  I subsequently increased her amlodipine  from 5 to 10 mg po qdaily, and placed ordered for amlodipine  5 mg po x 1 dose now.  Additionally, I escalated dose/frequency of existing order for as needed IV hydralazine , without dated order reflecting the following: Hydralazine  20 mg IV every 4 hours as needed for systolic blood pressure greater than 190 mmHg.      Eva Pore, DO Hospitalist

## 2024-01-22 NOTE — TOC Initial Note (Addendum)
 Transition of Care [facility]) - Initial/Assessment Note    Patient Details  Name: Amanda Meza MRN: [id number] Date of Birth: 1990/11/08  [facility] CM/SW Contact:    Andrez JULIANNA George, RN Phone Number: 01/22/2024, 2:46 PM  Clinical Narrative:                  Pt is from home with her daughter (age 33) and her daughter's father.  Pt uses [employer] for transportation needs.  She wasn't taking any prescription medications at home.  No DME.  No PCP and no health insurance. CM placed PCP appt on AVS. CM will add [facility] pharmacy to AVS for pt to use for assistance with the cost of her medications. CM emailed financial counseling to see if pt will qualify for medicaid. IP Care management following.  Expected Discharge Plan: OP Rehab Barriers to Discharge: Continued Medical Work up   Patient Goals and CMS Choice     Choice offered to / list presented to : Patient      Expected Discharge Plan and Services   Discharge Planning Services: CM Consult   Living arrangements for the past 2 months: Apartment                                      Prior Living Arrangements/Services Living arrangements for the past 2 months: Apartment Lives with:: Minor Children, Other (Comment) Patient language and need for interpreter reviewed:: Yes Do you feel safe going back to the place where you live?: Yes            Criminal Activity/Legal Involvement Pertinent to Current Situation/Hospitalization: No - Comment as needed  Activities of Daily Living   ADL Screening (condition at time of admission) Independently performs ADLs?: Yes (appropriate for developmental age) Is the patient deaf or have difficulty hearing?: No Does the patient have difficulty seeing, even when wearing glasses/contacts?: No Does the patient have difficulty concentrating, remembering, or making decisions?: No  Permission Sought/Granted                  Emotional Assessment Appearance:: Appears  stated age Attitude/Demeanor/Rapport: Engaged Affect (typically observed): Accepting Orientation: : Oriented to Self, Oriented to Place, Oriented to  Time, Oriented to Situation   Psych Involvement: No (comment)  Admission diagnosis:  Hypertensive urgency [I16.0] Acute CVA (cerebrovascular accident) [facility]) [I63.9] Patient Active Problem List   Diagnosis Date Noted   Acute CVA (cerebrovascular accident) (HCC) 01/22/2024   HTN (hypertension) 01/22/2024   Chronic hypertension with superimposed preeclampsia 10/20/2017   Gestational diabetes 09/21/2017   Chronic hypertension with superimposed pre-eclampsia 09/21/2017   Supervision of high risk pregnancy, antepartum 07/01/2017   Chronic hypertension during pregnancy, antepartum 07/01/2017   PCP:  Patient, No Pcp Per Pharmacy:   [facility] [id number] - [place], [place] - [address] [facility] [place] [address] Phone: [phone] Fax: [phone]  [facility] [address] [place] [address] Phone: [phone] Fax: [phone]     Social Drivers of Health (SDOH) Social History: SDOH Screenings   Food Insecurity: No Food Insecurity (01/22/2024)  Housing: Low Risk  (01/22/2024)  Transportation Needs: No Transportation Needs (01/22/2024)  Utilities: Not At Risk (01/22/2024)  Tobacco Use: Low Risk  (01/21/2024)   SDOH Interventions:     Readmission Risk Interventions     No data to display

## 2024-01-22 NOTE — Evaluation (Signed)
 Physical Therapy Evaluation Patient Details Name: Amanda Meza MRN: [id number] DOB: Oct 17, 1990 Today's Date: 01/22/2024  History of Present Illness  33 y.o. female presents to [facility] 01/21/24 due to having difficulty moving right hand. MRI brain revealed acute L MCA infarct. PMHx: HTN, anxiety  Clinical Impression  PTA, pt was independent for mobility and ADLs with no AD. Pt presents with decreased sensation, impaired coordination, and weakness in R arm. Pt was independent for all mobility with ability to ambulate and negotiate steps with no AD. Pt reported being at functional mobility baseline besides deficits in R arm. Defer further PT needs for neuro OP PT. Pt will have intermittent assist available upon d/c home. Acute PT signing off. Please re-consult if there are any changes in status.         If plan is discharge home, recommend the following: Assist for transportation   Can travel by private vehicle    Yes    Equipment Recommendations None recommended by PT     Functional Status Assessment Patient has had a recent decline in their functional status and demonstrates the ability to make significant improvements in function in a reasonable and predictable amount of time.     Precautions / Restrictions Precautions Precautions: None Restrictions Weight Bearing Restrictions Per Provider Order: No      Mobility  Bed Mobility Overal bed mobility: Needs Assistance Bed Mobility: Supine to Sit, Sit to Supine    Supine to sit: Independent Sit to supine: Independent     Transfers Overall transfer level: Independent Equipment used: None     Ambulation/Gait Ambulation/Gait assistance: Independent Gait Distance (Feet): 250 Feet Assistive device: None Gait Pattern/deviations: WFL(Within Functional Limits)    General Gait Details: steady gait with no overt LOB, slightly decreased speed  Stairs Stairs: Yes Stairs assistance: Independent Stair Management: No rails,  Alternating pattern, Forwards Number of Stairs: 2   Modified Rankin (Stroke Patients Only) Modified Rankin (Stroke Patients Only) Pre-Morbid Rankin Score: No symptoms Modified Rankin: Moderate disability     Balance Overall balance assessment: No apparent balance deficits (not formally assessed)        Pertinent Vitals/Pain Pain Assessment Pain Assessment: Faces Faces Pain Scale: Hurts little more Pain Location: R arm Pain Descriptors / Indicators: Heaviness Pain Intervention(s): Limited activity within patient's tolerance, Monitored during session, Repositioned    Home Living Family/patient expects to be discharged to:: Private residence Living Arrangements: Children (6 y.o. daughter) Available Help at Discharge: Family;Available PRN/intermittently Type of Home: Apartment Home Access: Stairs to enter Entrance Stairs-Rails: Left Entrance Stairs-Number of Steps: 6   Home Layout: One level Home Equipment: None      Prior Function Prior Level of Function : Independent/Modified Independent;Working/employed    Mobility Comments: ind ADLs Comments: ind, uses public transportation for mobility     Extremity/Trunk Assessment   Upper Extremity Assessment Upper Extremity Assessment: Defer to OT evaluation    Lower Extremity Assessment Lower Extremity Assessment: Overall WFL for tasks assessed    Cervical / Trunk Assessment Cervical / Trunk Assessment: Normal  Communication   Communication Communication: No apparent difficulties    Cognition Arousal: Alert Behavior During Therapy: WFL for tasks assessed/performed   PT - Cognitive impairments: No apparent impairments    Following commands: Intact       Cueing Cueing Techniques: Verbal cues, Tactile cues      PT Assessment All further PT needs can be met in the next venue of care  PT Problem List Decreased strength;Decreased range of  motion           PT Goals (Current goals can be found in the Care  Plan section)  Acute Rehab PT Goals PT Goal Formulation: All assessment and education complete, DC therapy     AM-PAC PT 6 Clicks Mobility  Outcome Measure Help needed turning from your back to your side while in a flat bed without using bedrails?: None Help needed moving from lying on your back to sitting on the side of a flat bed without using bedrails?: None Help needed moving to and from a bed to a chair (including a wheelchair)?: None Help needed standing up from a chair using your arms (e.g., wheelchair or bedside chair)?: None Help needed to walk in hospital room?: None Help needed climbing 3-5 steps with a railing? : None 6 Click Score: 24    End of Session Equipment Utilized During Treatment: Gait belt Activity Tolerance: Patient tolerated treatment well Patient left: in bed;with call bell/phone within reach Nurse Communication: Mobility status PT Visit Diagnosis: Muscle weakness (generalized) (M62.81)    Time: 9176-9162 PT Time Calculation (min) (ACUTE ONLY): 14 min   Charges:   PT Evaluation $PT Eval Low Complexity: 1 Low   PT General Charges $$ ACUTE PT VISIT: 1 Visit       Kate ORN, PT, DPT Secure Chat Preferred  Rehab Office [phone]  Kate BRAVO Wendolyn 01/22/2024, 8:45 AM

## 2024-01-22 NOTE — Progress Notes (Signed)
 VASCULAR LAB    TCD bubble study has been performed.  See CV proc for preliminary results.   Joshuajames Moehring, RVT 01/22/2024, 3:21 PM

## 2024-01-22 NOTE — Hospital Course (Addendum)
 Amanda Meza

## 2024-01-22 NOTE — Plan of Care (Signed)
  Problem: Education: Goal: Knowledge of disease or condition will improve Outcome: Progressing Goal: Knowledge of secondary prevention will improve (MUST DOCUMENT ALL) Outcome: Progressing Goal: Knowledge of patient specific risk factors will improve (DELETE if not current risk factor) Outcome: Progressing   Problem: Ischemic Stroke/TIA Tissue Perfusion: Goal: Complications of ischemic stroke/TIA will be minimized Outcome: Progressing   Problem: Coping: Goal: Will verbalize positive feelings about self Outcome: Progressing Goal: Will identify appropriate support needs Outcome: Progressing   Problem: Self-Care: Goal: Ability to participate in self-care as condition permits will improve Outcome: Progressing Goal: Verbalization of feelings and concerns over difficulty with self-care will improve Outcome: Progressing Goal: Ability to communicate needs accurately will improve Outcome: Progressing

## 2024-01-23 LAB — PROTEIN S ACTIVITY: Protein S Activity: 86 % (ref 63–140)

## 2024-01-23 LAB — LUPUS ANTICOAGULANT PANEL
DRVVT: 40.1 s (ref 0.0–47.0)
PTT Lupus Anticoagulant: 31.9 s (ref 0.0–43.5)

## 2024-01-23 LAB — PROTEIN S, TOTAL: Protein S Ag, Total: 87 % (ref 60–150)

## 2024-01-23 LAB — CARDIOLIPIN ANTIBODIES, IGG, IGM, IGA
Anticardiolipin IgA: <9 U/mL (ref 0–11)
Anticardiolipin IgG: <9 GPL U/mL (ref 0–14)
Anticardiolipin IgM: 33 [MPL'U]/mL — ABNORMAL HIGH (ref 0–12)

## 2024-01-23 LAB — ANA W/REFLEX IF POSITIVE: Anti Nuclear Antibody (ANA): NEGATIVE

## 2024-01-23 LAB — HOMOCYSTEINE: Homocysteine: 19.5 umol/L — ABNORMAL HIGH (ref 0.0–14.5)

## 2024-01-23 LAB — BETA-2-GLYCOPROTEIN I ABS, IGG/M/A
Beta-2 Glyco I IgG: <9 GPI IgG units (ref 0–20)
Beta-2-Glycoprotein I IgA: <9 GPI IgA units (ref 0–25)
Beta-2-Glycoprotein I IgM: <9 GPI IgM units (ref 0–32)

## 2024-01-23 LAB — PROTEIN C ACTIVITY: Protein C Activity: 117 % (ref 73–180)

## 2024-01-23 MED ORDER — CARVEDILOL 12.5 MG PO TABS
12.5000 mg | ORAL_TABLET | Freq: Two times a day (BID) | ORAL | Status: DC
  Administered 2024-01-23 – 2024-01-24 (×2): 12.5 mg via ORAL
  Filled 2024-01-23 (×2): qty 1

## 2024-01-23 MED ORDER — CARVEDILOL 6.25 MG PO TABS
6.2500 mg | ORAL_TABLET | Freq: Two times a day (BID) | ORAL | Status: DC
  Administered 2024-01-23: 6.25 mg via ORAL
  Filled 2024-01-23: qty 1

## 2024-01-23 MED ORDER — AMLODIPINE BESYLATE 5 MG PO TABS
10.0000 mg | ORAL_TABLET | Freq: Every day | ORAL | Status: DC
  Administered 2024-01-23 – 2024-01-24 (×2): 10 mg via ORAL
  Filled 2024-01-23 (×2): qty 2

## 2024-01-23 MED ORDER — LOSARTAN POTASSIUM 50 MG PO TABS
50.0000 mg | ORAL_TABLET | Freq: Every day | ORAL | Status: DC
  Administered 2024-01-23: 50 mg via ORAL
  Filled 2024-01-23: qty 1

## 2024-01-23 MED ORDER — LOSARTAN POTASSIUM 50 MG PO TABS
50.0000 mg | ORAL_TABLET | Freq: Once | ORAL | Status: AC
  Administered 2024-01-23: 50 mg via ORAL
  Filled 2024-01-23: qty 1

## 2024-01-23 MED ORDER — LOSARTAN POTASSIUM 50 MG PO TABS
100.0000 mg | ORAL_TABLET | Freq: Every day | ORAL | Status: DC
  Administered 2024-01-24: 100 mg via ORAL
  Filled 2024-01-23: qty 2

## 2024-01-23 NOTE — Plan of Care (Signed)
  Problem: Education: Goal: Knowledge of disease or condition will improve Outcome: Progressing   Problem: Ischemic Stroke/TIA Tissue Perfusion: Goal: Complications of ischemic stroke/TIA will be minimized Outcome: Progressing   Problem: Coping: Goal: Will verbalize positive feelings about self Outcome: Progressing   Problem: Health Behavior/Discharge Planning: Goal: Goals will be collaboratively established with patient/family Outcome: Progressing   Problem: Nutrition: Goal: Risk of aspiration will decrease Outcome: Progressing   Problem: Education: Goal: Knowledge of General Education information will improve Description: Including pain rating scale, medication(s)/side effects and non-pharmacologic comfort measures Outcome: Progressing   Problem: Clinical Measurements: Goal: Respiratory complications will improve Outcome: Progressing Goal: Cardiovascular complication will be avoided Outcome: Progressing   Problem: Activity: Goal: Risk for activity intolerance will decrease Outcome: Progressing   Problem: Coping: Goal: Level of anxiety will decrease Outcome: Progressing   Problem: Skin Integrity: Goal: Risk for impaired skin integrity will decrease Outcome: Progressing

## 2024-01-23 NOTE — Progress Notes (Signed)
 PROGRESS NOTE  Amanda Meza FMW:[id number] DOB: Feb 23, 1991 DOA: 01/21/2024 PCP: Patient, No Pcp Per   LOS: 1 day   Brief narrative:  Same day note  Amanda Meza is a 33 y.o. female with history of hypertension presented to hospital with difficulty moving her right hand 4 weeks with some pain.  She initially thought it could be secondary to lifting heavy boxes and carpal tunnel syndrome due to overuse injury.  Patient does not have current pain but has been having trouble moving her fingers and her coworkers advised her to seek medical attention.  In the ED patient was noted to have elevated blood pressure of 233/129.  She did have a CT and MRI which revealed an acute left MCA infarct.  Neurology was consulted and patient was admitted hospital for further evaluation and treatment.     Assessment/Plan: Principal Problem:   Acute CVA (cerebrovascular accident) (HCC) Active Problems:   HTN (hypertension)  Essential hypertension, uncontrolled Goal normotension since symptoms been going on for last 7 days.  Continue losartan  and Norvasc  and has been started on Coreg  from today.  Still blood pressure is extremely elevated.  Spoke with the patient regarding significantly elevated blood pressure and stroke risk.  Will need closer monitoring of blood pressure to see the downgoing trend prior to discharge.  Acute left MCA CVA.  CT head scan was positive for left posterior MCA distribution infarct.  MRI of the brain shows moderately large area of ischemic stroke in the posterior left MCA territory with associated petechial blood products.  Contrast enhancement in the leptomeningeal area in the infarcted territory.  Neurology has seen the patient.  Continue stroke workup.  CTA of the head and neck with short segment occlusion of the proximal M1 segment of the left middle cerebral artery.  2D echocardiogram pending.  Aspirin  and Plavix  for now.  Continue statins.  LDL goal less than 70.   Blood pressure goal is normotension since symptoms been going on for 7 days.  Still not improved.  Will continue to adjust antihypertensives while in the hospital.  DVT prophylaxis: enoxaparin  (LOVENOX ) injection 40 mg Start: 01/22/24 1000   Disposition: Likely home 01/24/2024 if blood pressure improved  Status is: Inpatient Remains inpatient appropriate because: Uncontrolled blood pressure, stroke    Code Status:     Code Status: Full Code  Family Communication: None at bedside  Consultants: Neurology  Procedures: None  Anti-infectives:  None  Anti-infectives (From admission, onward)    None        Subjective: Today, patient was seen and examined at bedside.  Patient states that she wishes to go home but has extremely elevated blood pressure.  Denies any nausea vomiting fever chills or rigor.  Objective: Vitals:   01/23/24 1205 01/23/24 1518  BP: (!) 187/110 (!) 172/109  Pulse: 96   Resp:  16  Temp:  99 F (37.2 C)  SpO2:      Intake/Output Summary (Last 24 hours) at 01/23/2024 1540 Last data filed at 01/22/2024 1703 Gross per 24 hour  Intake 236 ml  Output --  Net 236 ml   Filed Weights   01/21/24 1701  Weight: 79.8 kg   Body mass index is 30.21 kg/m.   Physical Exam:  GENERAL: Patient is alert awake and oriented. Not in obvious distress.  Slightly obese. HENT: No scleral pallor or icterus. Pupils equally reactive to light. Oral mucosa is moist NECK: is supple, no gross swelling noted. CHEST: Clear to auscultation.  No crackles or wheezes.  Diminished breath sounds bilaterally. CVS: S1 and S2 heard, no murmur. Regular rate and rhythm.  ABDOMEN: Soft, non-tender, bowel sounds are present. EXTREMITIES: No edema. CNS: Cranial nerves are intact. No focal motor deficits.  Moves all extremities. SKIN: warm and dry without rashes.  Data Review: I have personally reviewed the following laboratory data and studies,  CBC: Recent Labs  Lab 01/21/24 1746  01/21/24 1755 01/22/24 0554  WBC 6.5  --  6.3  NEUTROABS 4.1  --   --   HGB 9.7* 11.2* 9.3*  HCT 32.1* 33.0* 29.6*  MCV 81.5  --  78.3*  PLT 418*  --  364   Basic Metabolic Panel: Recent Labs  Lab 01/21/24 1746 01/21/24 1755 01/22/24 0554  NA 135 138 135  K 3.5 3.6 3.3*  CL 102 105 101  CO2 21*  --  23  GLUCOSE 99 99 114*  BUN 9 8 8   CREATININE 0.78 0.80 0.71  CALCIUM  8.9  --  8.8*  MG 1.9  --   --    Liver Function Tests: No results for input(s): AST, ALT, ALKPHOS, BILITOT, PROT, ALBUMIN in the last 168 hours. No results for input(s): LIPASE, AMYLASE in the last 168 hours. No results for input(s): AMMONIA in the last 168 hours. Cardiac Enzymes: No results for input(s): CKTOTAL, CKMB, CKMBINDEX, TROPONINI in the last 168 hours. BNP (last 3 results) No results for input(s): BNP in the last 8760 hours.  ProBNP (last 3 results) No results for input(s): PROBNP in the last 8760 hours.  CBG: No results for input(s): GLUCAP in the last 168 hours. No results found for this or any previous visit (from the past 240 hours).   Studies: VAS US  TRANSCRANIAL DOPPLER W BUBBLES Result Date: 01/22/2024  Transcranial Doppler with Bubble Patient Name:  Amanda Meza  Date of Exam:   01/22/2024 Medical Rec #: [id number]              Accession #:    [id number] Date of Birth: [birth year]              Patient Gender: F Patient Age:   88 years Exam Location:  [facility] Procedure:      VAS US  TRANSCRANIAL DOPPLER W BUBBLES Referring Phys: ARY XU --------------------------------------------------------------------------------  Indications: Stroke. History: Hypertensive emergency. Comparison Study: No prior study Performing Technologist: Alberta Lis RVS  Examination Guidelines: A complete evaluation includes B-mode imaging, spectral Doppler, color Doppler, and power Doppler as needed of all accessible portions of each vessel. Bilateral testing is  considered an integral part of a complete examination. Limited examinations for reoccurring indications may be performed as noted.  Summary: No HITS at rest or during Valsalva. Negative transcranial Doppler Bubble study with no evidence of right to left intracardiac communication.  A vascular evaluation was performed. The right middle cerebral artery was studied. An IV was inserted into the patient's left forearm. Verbal informed consent was obtained.   Diagnosing physician: ARY Cummins MD Electronically signed by ARY Cummins MD on 01/22/2024 at 4:45:51 PM.    Final    VAS US  LOWER EXTREMITY VENOUS (DVT) Result Date: 01/22/2024  Lower Venous DVT Study Patient Name:  Amanda Meza  Date of Exam:   01/22/2024 Medical Rec #: [id number]              Accession #:    [id number] Date of Birth: 30-Nov-1990  Patient Gender: F Patient Age:   67 years Exam Location:  [facility] Procedure:      VAS US  LOWER EXTREMITY VENOUS (DVT) Referring Phys: ARY XU --------------------------------------------------------------------------------  Indications: Stroke.  Comparison Study: No prior study on file Performing Technologist: Alberta Lis RVS  Examination Guidelines: A complete evaluation includes B-mode imaging, spectral Doppler, color Doppler, and power Doppler as needed of all accessible portions of each vessel. Bilateral testing is considered an integral part of a complete examination. Limited examinations for reoccurring indications may be performed as noted. The reflux portion of the exam is performed with the patient in reverse Trendelenburg.  +---------+---------------+---------+-----------+----------+--------------+ RIGHT    CompressibilityPhasicitySpontaneityPropertiesThrombus Aging +---------+---------------+---------+-----------+----------+--------------+ CFV      Full           Yes      No                                   +---------+---------------+---------+-----------+----------+--------------+ SFJ      Full                                                        +---------+---------------+---------+-----------+----------+--------------+ FV Prox  Full                                                        +---------+---------------+---------+-----------+----------+--------------+ FV Mid   Full                                                        +---------+---------------+---------+-----------+----------+--------------+ FV DistalFull           Yes      Yes                                 +---------+---------------+---------+-----------+----------+--------------+ PFV      Full                                                        +---------+---------------+---------+-----------+----------+--------------+ POP      Full           Yes      No                                  +---------+---------------+---------+-----------+----------+--------------+ PTV      Full                                                        +---------+---------------+---------+-----------+----------+--------------+ PERO  Full                                                        +---------+---------------+---------+-----------+----------+--------------+   +---------+---------------+---------+-----------+----------+--------------+ LEFT     CompressibilityPhasicitySpontaneityPropertiesThrombus Aging +---------+---------------+---------+-----------+----------+--------------+ CFV      Full           Yes      No                                  +---------+---------------+---------+-----------+----------+--------------+ SFJ      Full                                                        +---------+---------------+---------+-----------+----------+--------------+ FV Prox  Full           Yes      No                                   +---------+---------------+---------+-----------+----------+--------------+ FV Mid   Full                                                        +---------+---------------+---------+-----------+----------+--------------+ FV DistalFull           Yes      Yes                                 +---------+---------------+---------+-----------+----------+--------------+ PFV      Full                                                        +---------+---------------+---------+-----------+----------+--------------+ POP      Full           Yes      Yes                                 +---------+---------------+---------+-----------+----------+--------------+ PTV      Full                                                        +---------+---------------+---------+-----------+----------+--------------+ PERO     Full                                                        +---------+---------------+---------+-----------+----------+--------------+  Summary: RIGHT: - There is no evidence of deep vein thrombosis in the lower extremity.  - No cystic structure found in the popliteal fossa.  LEFT: - There is no evidence of deep vein thrombosis in the lower extremity.  - No cystic structure found in the popliteal fossa.  *See table(s) above for measurements and observations.    Preliminary    ECHOCARDIOGRAM COMPLETE Result Date: 01/22/2024    ECHOCARDIOGRAM REPORT   Patient Name:   Amanda Meza Date of Exam: 01/22/2024 Medical Rec #:  [id number]             Height:       64.0 in Accession #:    [id number]            Weight:       176.0 lb Date of Birth:  [birth year]             BSA:          1.853 m Patient Age:    33 years              BP:           118/68 mmHg Patient Gender: F                     HR:           90 bpm. Exam Location:  Inpatient Procedure: 2D Echo, Cardiac Doppler and Color Doppler (Both Spectral and Color            Flow Doppler were utilized during procedure).  Indications:    Stroke I63.9  History:        Patient has no prior history of Echocardiogram examinations.                 Risk Factors:Hypertension and Diabetes.  Sonographer:    BERNARDA ROCKS Referring Phys: [id number] ASHISH ARORA IMPRESSIONS  1. Left ventricular ejection fraction, by estimation, is 65 to 70%. The left ventricle has normal function. The left ventricle has no regional wall motion abnormalities. Left ventricular diastolic parameters were normal.  2. Right ventricular systolic function is normal. The right ventricular size is normal.  3. There is no evidence of cardiac tamponade.  4. The mitral valve is grossly normal. No evidence of mitral valve regurgitation. No evidence of mitral stenosis.  5. The aortic valve is tricuspid. Aortic valve regurgitation is not visualized. No aortic stenosis is present.  6. The inferior vena cava is normal in size with greater than 50% respiratory variability, suggesting right atrial pressure of 3 mmHg. Comparison(s): No prior Echocardiogram. Conclusion(s)/Recommendation(s): No intracardiac source of embolism detected on this transthoracic study. Consider a transesophageal echocardiogram to exclude cardiac source of embolism if clinically indicated. FINDINGS  Left Ventricle: Left ventricular ejection fraction, by estimation, is 65 to 70%. The left ventricle has normal function. The left ventricle has no regional wall motion abnormalities. The left ventricular internal cavity size was normal in size. There is  no left ventricular hypertrophy. Left ventricular diastolic parameters were normal. Right Ventricle: The right ventricular size is normal. No increase in right ventricular wall thickness. Right ventricular systolic function is normal. Left Atrium: Left atrial size was normal in size. Right Atrium: Right atrial size was normal in size. Pericardium: Trivial pericardial effusion is present. The pericardial effusion is posterior to the left ventricle and localized near  the right atrium. There is no evidence of cardiac tamponade. Mitral Valve: The mitral valve is grossly normal.  No evidence of mitral valve regurgitation. No evidence of mitral valve stenosis. MV peak gradient, 3.9 mmHg. The mean mitral valve gradient is 2.0 mmHg. Tricuspid Valve: The tricuspid valve is normal in structure. Tricuspid valve regurgitation is not demonstrated. No evidence of tricuspid stenosis. Aortic Valve: The aortic valve is tricuspid. Aortic valve regurgitation is not visualized. No aortic stenosis is present. Aortic valve mean gradient measures 5.0 mmHg. Aortic valve peak gradient measures 11.2 mmHg. Aortic valve area, by VTI measures 2.62  cm. Pulmonic Valve: The pulmonic valve was normal in structure. Pulmonic valve regurgitation is not visualized. No evidence of pulmonic stenosis. Aorta: The aortic root and ascending aorta are structurally normal, with no evidence of dilitation. Venous: The inferior vena cava is normal in size with greater than 50% respiratory variability, suggesting right atrial pressure of 3 mmHg. IAS/Shunts: The atrial septum is grossly normal.  LEFT VENTRICLE PLAX 2D LVIDd:         4.80 cm      Diastology LVIDs:         3.30 cm      LV e' medial:    7.07 cm/s LV PW:         1.10 cm      LV E/e' medial:  12.1 LV IVS:        1.10 cm      LV e' lateral:   7.40 cm/s LVOT diam:     2.00 cm      LV E/e' lateral: 11.6 LV SV:         74 LV SV Index:   40 LVOT Area:     3.14 cm  LV Volumes (MOD) LV vol d, MOD A2C: 123.0 ml LV vol d, MOD A4C: 144.0 ml LV vol s, MOD A2C: 45.5 ml LV vol s, MOD A4C: 60.8 ml LV SV MOD A2C:     77.5 ml LV SV MOD A4C:     144.0 ml LV SV MOD BP:      80.5 ml RIGHT VENTRICLE             IVC RV Basal diam:  3.10 cm     IVC diam: 1.60 cm RV S prime:     14.20 cm/s TAPSE (M-mode): 2.4 cm LEFT ATRIUM             Index        RIGHT ATRIUM          Index LA diam:        3.50 cm 1.89 cm/m   RA Area:     7.84 cm LA Vol (A2C):   71.4 ml 38.53 ml/m  RA Volume:    12.30 ml 6.64 ml/m LA Vol (A4C):   40.1 ml 21.64 ml/m LA Biplane Vol: 54.5 ml 29.41 ml/m  AORTIC VALVE                     PULMONIC VALVE AV Area (Vmax):    2.86 cm      PV Vmax:          1.41 m/s AV Area (Vmean):   2.55 cm      PV Peak grad:     8.0 mmHg AV Area (VTI):     2.62 cm      PR End Diast Vel: 3.47 msec AV Vmax:           167.00 cm/s AV Vmean:          101.000 cm/s AV  VTI:            0.283 m AV Peak Grad:      11.2 mmHg AV Mean Grad:      5.0 mmHg LVOT Vmax:         152.00 cm/s LVOT Vmean:        82.100 cm/s LVOT VTI:          0.236 m LVOT/AV VTI ratio: 0.83  AORTA Ao Root diam: 2.50 cm Ao Asc diam:  2.90 cm MITRAL VALVE MV Area (PHT): 6.02 cm     SHUNTS MV Area VTI:   3.48 cm     Systemic VTI:  0.24 m MV Peak grad:  3.9 mmHg     Systemic Diam: 2.00 cm MV Mean grad:  2.0 mmHg MV Vmax:       0.99 m/s MV Vmean:      62.7 cm/s MV Decel Time: 126 msec MV E velocity: 85.90 cm/s MV A velocity: 101.00 cm/s MV E/A ratio:  0.85 Sunit Tolia Electronically signed by Madonna Large Signature Date/Time: 01/22/2024/12:43:23 PM    Final    MR Brain W and Wo Contrast Result Date: 01/22/2024 EXAM: MRI BRAIN WITH AND WITHOUT CONTRAST 01/21/2024 11:35:00 PM TECHNIQUE: Multiplanar multisequence MRI of the head/brain was performed with and without the administration of intravenous contrast. COMPARISON: None available. CLINICAL HISTORY: Neuro deficit, acute, stroke suspected. FINDINGS: BRAIN AND VENTRICLES: Moderately large area of acute ischemia within the posterior left MCA territory. Small areas of ischemia within the left basal ganglia and left cerebral peduncle are also noted. Petechial blood products within the infarcted territory but no hemorrhagic transformation. Multifocal hyperintense T2-weighted signal within the cerebral white matter, most commonly due to chronic small vessel disease. There is leptomeningeal contrast enhancement within the infarcted territory which is an expected finding in the setting of  subacute ischemia. No mass effect or midline shift. No hydrocephalus. Normal flow voids. No mass. ORBITS: No acute abnormality. SINUSES: No acute abnormality. BONES AND SOFT TISSUES: Normal bone marrow signal and enhancement. No acute soft tissue abnormality. IMPRESSION: 1. Moderately large area of acute ischemia within the posterior left MCA territory, with associated petechial blood products but no hemorrhagic transformation. 2. Leptomeningeal contrast enhancement within the infarcted territory, an expected finding in the setting of subacute ischemia. 3. Background multifocal hyperintense T2-weighted signal within the cerebral white matter, most commonly due to chronic small vessel disease. Electronically signed by: Franky Stanford MD 01/22/2024 12:02 AM EDT RP Workstation: [id number]   CT ANGIO HEAD NECK W WO CM Result Date: 01/21/2024 EXAM: CTA Head and Neck without and with Intravenous Contrast. CT Head without Contrast. CLINICAL HISTORY: Stroke/TIA, determine embolic source. CT ANGIO HEAD NECK W WO CM; Stroke/TIA, determine embolic source TECHNIQUE: Axial CTA images of the head and neck performed with intravenous contrast. MIP reconstructed images were created and reviewed. Axial computed tomography images of the head/brain performed without intravenous contrast. Note: Per PQRS, the description of internal carotid artery percent stenosis, including 0 percent or normal exam, is based on [place] American Symptomatic Carotid Endarterectomy Trial (NASCET) criteria. Dose reduction technique was used including one or more of the following: automated exposure control, adjustment of mA and kV according to patient size, and/or iterative reconstruction. CONTRAST: 75mL (iohexol  (OMNIPAQUE ) 350 MG/ML injection 75 mL IOHEXOL  350 MG/ML SOLN) COMPARISON: None provided. FINDINGS: CT HEAD: BRAIN: Acute / early subacute infarct within the posterior left MCA territory. VENTRICLES: No hydrocephalus. ORBITS: The orbits are  unremarkable. SINUSES AND MASTOIDS: The  paranasal sinuses and mastoid air cells are clear. CTA NECK: COMMON CAROTID ARTERIES: No significant stenosis. No dissection or occlusion. INTERNAL CAROTID ARTERIES: No stenosis by NASCET criteria. No dissection or occlusion. VERTEBRAL ARTERIES: No significant stenosis. No dissection or occlusion. CTA HEAD: ANTERIOR CEREBRAL ARTERIES: No significant stenosis. No occlusion. No aneurysm. MIDDLE CEREBRAL ARTERIES: There is a short segment (4mm) occlusion of the proximal M1 segment of the left middle cerebral artery. POSTERIOR CEREBRAL ARTERIES: No significant stenosis. No occlusion. No aneurysm. BASILAR ARTERY: No significant stenosis. No occlusion. No aneurysm. OTHER: SOFT TISSUES: No acute finding. No masses or lymphadenopathy. BONES: No acute osseous abnormality. IMPRESSION: 1. Acute/early subacute infarct within the posterior left MCA territory. 2. Short segment occlusion of the proximal M1 segment of the left middle cerebral artery. 3. Findings communicated to Dr. Ashish Arora at 10:48 pm 01/21/2024 Electronically signed by: Franky Stanford MD 01/21/2024 10:50 PM EDT RP Workstation: [id number]   CT Head Wo Contrast Result Date: 01/21/2024 CLINICAL DATA:  Neuro deficit, acute, stroke suspected RUE weakness EXAM: CT HEAD WITHOUT CONTRAST TECHNIQUE: Contiguous axial images were obtained from the base of the skull through the vertex without intravenous contrast. RADIATION DOSE REDUCTION: This exam was performed according to the departmental dose-optimization program which includes automated exposure control, adjustment of the mA and/or kV according to patient size and/or use of iterative reconstruction technique. COMPARISON:  None Available. FINDINGS: Brain: Acute loss of gray-white matter differentiation centered along the left parietal region extending to the left frontal, occipital, temporal lobes. No parenchymal hemorrhage. No mass lesion. No extra-axial collection. Edema  along the infarction with loss of sulci. No midline shift. No hydrocephalus. Basilar cisterns are patent. Vascular: No hyperdense vessel. Skull: No acute fracture or focal lesion. Sinuses/Orbits: Paranasal sinuses and mastoid air cells are clear. The orbits are unremarkable. Other: None. IMPRESSION: 1. Acute left posterior MCA distrubution infarction. 2. No acute intracranial hemorrhage. These results were called by telephone at the time of interpretation on 01/21/2024 at 9:22 pm to provider [facility] , who verbally acknowledged these results. Electronically Signed   By: Morgane  Naveau M.D.   On: 01/21/2024 21:24      Vernal Alstrom, MD  [facility] 01/23/2024  If 7PM-7AM, please contact night-coverage

## 2024-01-23 NOTE — Plan of Care (Signed)
  Problem: Education: Goal: Knowledge of disease or condition will improve Outcome: Progressing   Problem: Ischemic Stroke/TIA Tissue Perfusion: Goal: Complications of ischemic stroke/TIA will be minimized Outcome: Progressing   Problem: Nutrition: Goal: Risk of aspiration will decrease Outcome: Progressing Goal: Dietary intake will improve Outcome: Progressing

## 2024-01-24 ENCOUNTER — Other Ambulatory Visit (HOSPITAL_COMMUNITY): Payer: Self-pay

## 2024-01-24 MED ORDER — ASPIRIN 81 MG PO TBEC: 81.0000 mg | DELAYED_RELEASE_TABLET | Freq: Every day | ORAL | 0 refills | Status: DC

## 2024-01-24 MED ORDER — CARVEDILOL 12.5 MG PO TABS: 12.5000 mg | ORAL_TABLET | Freq: Two times a day (BID) | ORAL | 2 refills | Status: DC

## 2024-01-24 MED ORDER — ROSUVASTATIN CALCIUM 20 MG PO TABS: 20.0000 mg | ORAL_TABLET | Freq: Every day | ORAL | 0 refills | Status: DC

## 2024-01-24 MED ORDER — LOSARTAN POTASSIUM 100 MG PO TABS
100.0000 mg | ORAL_TABLET | Freq: Every evening | ORAL | 2 refills | Status: DC
  Filled 2024-01-24 – 2024-02-22 (×2): qty 30, 30d supply, fill #0
  Filled 2024-03-17: qty 30, 30d supply, fill #1

## 2024-01-24 MED ORDER — CLOPIDOGREL BISULFATE 75 MG PO TABS
75.0000 mg | ORAL_TABLET | Freq: Every day | ORAL | 0 refills | Status: DC
  Filled 2024-01-24 – 2024-02-22 (×2): qty 30, 30d supply, fill #0
  Filled 2024-03-17: qty 30, 30d supply, fill #1

## 2024-01-24 MED ORDER — ROSUVASTATIN CALCIUM 20 MG PO TABS
20.0000 mg | ORAL_TABLET | Freq: Every day | ORAL | 0 refills | Status: DC
  Filled 2024-01-24 – 2024-02-22 (×2): qty 30, 30d supply, fill #0
  Filled 2024-03-17: qty 30, 30d supply, fill #1

## 2024-01-24 MED ORDER — CLOPIDOGREL BISULFATE 75 MG PO TABS: 75.0000 mg | ORAL_TABLET | Freq: Every day | ORAL | 0 refills | Status: DC

## 2024-01-24 MED ORDER — LOSARTAN POTASSIUM 100 MG PO TABS: 100.0000 mg | ORAL_TABLET | Freq: Every evening | ORAL | 2 refills | Status: DC

## 2024-01-24 MED ORDER — CARVEDILOL 12.5 MG PO TABS
12.5000 mg | ORAL_TABLET | Freq: Two times a day (BID) | ORAL | 2 refills | Status: DC
  Filled 2024-01-24: qty 60, 30d supply, fill #0

## 2024-01-24 MED ORDER — ASPIRIN 81 MG PO TBEC
81.0000 mg | DELAYED_RELEASE_TABLET | Freq: Every day | ORAL | 0 refills | Status: DC
  Filled 2024-01-24: qty 90, 90d supply, fill #0

## 2024-01-24 MED ORDER — AMLODIPINE BESYLATE 10 MG PO TABS
10.0000 mg | ORAL_TABLET | Freq: Every day | ORAL | 2 refills | Status: DC
  Filled 2024-01-24 – 2024-02-22 (×2): qty 30, 30d supply, fill #0
  Filled 2024-03-17: qty 30, 30d supply, fill #1

## 2024-01-24 MED ORDER — AMLODIPINE BESYLATE 10 MG PO TABS: 10.0000 mg | ORAL_TABLET | Freq: Every day | ORAL | 2 refills | Status: DC

## 2024-01-24 NOTE — Plan of Care (Signed)
  Problem: Education: Goal: Knowledge of disease or condition will improve Outcome: Progressing Goal: Knowledge of secondary prevention will improve (MUST DOCUMENT ALL) Outcome: Progressing Goal: Knowledge of patient specific risk factors will improve (DELETE if not current risk factor) Outcome: Progressing   Problem: Ischemic Stroke/TIA Tissue Perfusion: Goal: Complications of ischemic stroke/TIA will be minimized Outcome: Progressing   Problem: Education: Goal: Knowledge of General Education information will improve Description: Including pain rating scale, medication(s)/side effects and non-pharmacologic comfort measures Outcome: Progressing   Problem: Pain Managment: Goal: General experience of comfort will improve and/or be controlled Outcome: Progressing

## 2024-01-24 NOTE — Progress Notes (Signed)
 MATCH letter for patient-patient with no insurance.  Patient to have prescription(s) filled by [facility].

## 2024-01-24 NOTE — Progress Notes (Signed)
 AVS instructions given, pt verbalized understanding, meds received from [facility] pharmacy, wheeling her down to the Main Entrance A to sister's car.

## 2024-01-24 NOTE — Discharge Summary (Signed)
 Physician Discharge Summary  Amanda Meza FMW:[id number] DOB: 10/26/1990 DOA: 01/21/2024  PCP: Patient, No Pcp Per  Admit date: 01/21/2024 Discharge date: 01/24/2024  Admitted From: Home  Discharge disposition: Home   Recommendations for Outpatient Follow-Up:   Follow up with your primary care provider in one week.  Check CBC, BMP, magnesium  in the next visit Patient will need adequate monitoring of her blood pressure and adjustment in blood pressure medications as outpatient. Follow-up recommended with outpatient TEE with cardiology, cardiology to call for appointment. Follow-up with [facility] as scheduled by the clinic for stroke follow-up.   Discharge Diagnosis:   Principal Problem:   Acute CVA (cerebrovascular accident) (HCC) Active Problems:   HTN (hypertension)   Discharge Condition: Improved.  Diet recommendation: Low sodium, heart healthy.    Wound care: None.  Code status: Full.   History of Present Illness:   Amanda Meza is a 33 y.o. female with history of hypertension presented to hospital with difficulty moving her right hand 4 weeks with some pain.  She initially thought it could be secondary to lifting heavy boxes and carpal tunnel syndrome due to overuse injury.  Patient does not have current pain but has been having trouble moving her fingers and her coworkers advised her to seek medical attention.  In the ED, patient was noted to have elevated blood pressure of 233/129.  She did have a CT and MRI which revealed an acute left MCA infarct.  Neurology was consulted and patient was admitted hospital for further evaluation and treatmen   Hospital Course:   Following conditions were addressed during hospitalization as listed below,  Essential hypertension, uncontrolled Goal normotension since symptoms been going on for more than 7 days.  Patient was initiated on losartan ,Norvasc  and during hospitalization with up titration  of 4 doses for adequate blood pressure control.  Risk of recurrent stroke was thoroughly emphasized to the patient.  Compliance to medications and lifestyle modification was emphasized.   Acute left MCA CVA.  CT head scan was positive for left posterior MCA distribution infarct.  MRI of the brain shows moderately large area of ischemic stroke in the posterior left MCA territory with associated petechial blood products.  Contrast enhancement in the leptomeningeal area in the infarcted territory.  Was consulted.  CTA of the head and neck with short segment occlusion of the proximal M1 segment of the left middle cerebral artery.  2D echocardiogram showed preserved LV function at 65 to 70%.  Plan for TEE as outpatient.  Communicated with cardiology for arranging this.  Lower extremity duplex venous was negative for DVT.  Transcranial Doppler was negative study.  Neurology has recommended aspirin  and Plavix  for 3 months followed by continuation of her off aspirin . Continue statins.  LDL goal less than 70.  Blood pressure goal is normotension but will need to continue to be adjusted as outpatient with PCP.  Disposition.  At this time, patient is stable for disposition home with outpatient PCP, neurology and cardiology follow-up  Medical Consultants:   Neurology  Procedures:    None Subjective:   Today, patient was seen and examined at bedside.  Denies any dizziness, lightheadedness shortness of breath chest pain palpitation but feels a little fatigued.  Blood pressure has improved on the current regimen.  Discharge Exam:   Vitals:   01/24/24 1113 01/24/24 1156  BP: (!) 153/91 (!) 159/94  Pulse:  79  Resp:  14  Temp:  98 F (36.7 C)  SpO2:  99%   Vitals:   01/24/24 0753 01/24/24 0950 01/24/24 1113 01/24/24 1156  BP: (!) 155/86 (!) 161/95 (!) 153/91 (!) 159/94  Pulse: 80   79  Resp: 16   14  Temp: 98.5 F (36.9 C)   98 F (36.7 C)  TempSrc: Oral   Oral  SpO2: 99%   99%  Weight:       Height:       Body mass index is 30.21 kg/m.   General: Alert awake, not in obvious distress HENT: pupils equally reacting to light,  No scleral pallor or icterus noted. Oral mucosa is moist.  Chest:    Diminished breath sounds bilaterally. No crackles or wheezes.  CVS: S1 &S2 heard. No murmur.  Regular rate and rhythm. Abdomen: Soft, nontender, nondistended.  Bowel sounds are heard.   Extremities: No cyanosis, clubbing or edema.  Peripheral pulses are palpable. Psych: Alert, awake and oriented, normal mood CNS:  No cranial nerve deficits.  s.  Mild grip weakness on the right side  Skin: Warm and dry.  No rashes noted.  The results of significant diagnostics from this hospitalization (including imaging, microbiology, ancillary and laboratory) are listed below for reference.     Diagnostic Studies:   VAS US  TRANSCRANIAL DOPPLER W BUBBLES Result Date: 01/22/2024  Transcranial Doppler with Bubble Patient Name:  Amanda Meza  Date of Exam:   01/22/2024 Medical Rec #: [id number]              Accession #:    [id number] Date of Birth: 1991-01-15              Patient Gender: F Patient Age:   22 years Exam Location:  [facility] Procedure:      VAS US  TRANSCRANIAL DOPPLER W BUBBLES Referring Phys: ARY XU --------------------------------------------------------------------------------  Indications: Stroke. History: Hypertensive emergency. Comparison Study: No prior study Performing Technologist: Alberta Lis RVS  Examination Guidelines: A complete evaluation includes B-mode imaging, spectral Doppler, color Doppler, and power Doppler as needed of all accessible portions of each vessel. Bilateral testing is considered an integral part of a complete examination. Limited examinations for reoccurring indications may be performed as noted.  Summary: No HITS at rest or during Valsalva. Negative transcranial Doppler Bubble study with no evidence of right to left intracardiac communication.  A  vascular evaluation was performed. The right middle cerebral artery was studied. An IV was inserted into the patient's left forearm. Verbal informed consent was obtained.   Diagnosing physician: ARY Cummins MD Electronically signed by ARY Cummins MD on 01/22/2024 at 4:45:51 PM.    Final    VAS US  LOWER EXTREMITY VENOUS (DVT) Result Date: 01/22/2024  Lower Venous DVT Study Patient Name:  Amanda Meza  Date of Exam:   01/22/2024 Medical Rec #: [id number]              Accession #:    [id number] Date of Birth: Aug 24, 1990              Patient Gender: F Patient Age:   53 years Exam Location:  [facility] Procedure:      VAS US  LOWER EXTREMITY VENOUS (DVT) Referring Phys: ARY CUMMINS --------------------------------------------------------------------------------  Indications: Stroke.  Comparison Study: No prior study on file Performing Technologist: Alberta Lis RVS  Examination Guidelines: A complete evaluation includes B-mode imaging, spectral Doppler, color Doppler, and power Doppler as needed of all accessible portions of each vessel. Bilateral testing is considered an integral part of a  complete examination. Limited examinations for reoccurring indications may be performed as noted. The reflux portion of the exam is performed with the patient in reverse Trendelenburg.  +---------+---------------+---------+-----------+----------+--------------+ RIGHT    CompressibilityPhasicitySpontaneityPropertiesThrombus Aging +---------+---------------+---------+-----------+----------+--------------+ CFV      Full           Yes      No                                  +---------+---------------+---------+-----------+----------+--------------+ SFJ      Full                                                        +---------+---------------+---------+-----------+----------+--------------+ FV Prox  Full                                                         +---------+---------------+---------+-----------+----------+--------------+ FV Mid   Full                                                        +---------+---------------+---------+-----------+----------+--------------+ FV DistalFull           Yes      Yes                                 +---------+---------------+---------+-----------+----------+--------------+ PFV      Full                                                        +---------+---------------+---------+-----------+----------+--------------+ POP      Full           Yes      No                                  +---------+---------------+---------+-----------+----------+--------------+ PTV      Full                                                        +---------+---------------+---------+-----------+----------+--------------+ PERO     Full                                                        +---------+---------------+---------+-----------+----------+--------------+   +---------+---------------+---------+-----------+----------+--------------+ LEFT     CompressibilityPhasicitySpontaneityPropertiesThrombus Aging +---------+---------------+---------+-----------+----------+--------------+ CFV      Full  Yes      No                                  +---------+---------------+---------+-----------+----------+--------------+ SFJ      Full                                                        +---------+---------------+---------+-----------+----------+--------------+ FV Prox  Full           Yes      No                                  +---------+---------------+---------+-----------+----------+--------------+ FV Mid   Full                                                        +---------+---------------+---------+-----------+----------+--------------+ FV DistalFull           Yes      Yes                                  +---------+---------------+---------+-----------+----------+--------------+ PFV      Full                                                        +---------+---------------+---------+-----------+----------+--------------+ POP      Full           Yes      Yes                                 +---------+---------------+---------+-----------+----------+--------------+ PTV      Full                                                        +---------+---------------+---------+-----------+----------+--------------+ PERO     Full                                                        +---------+---------------+---------+-----------+----------+--------------+    Summary: RIGHT: - There is no evidence of deep vein thrombosis in the lower extremity.  - No cystic structure found in the popliteal fossa.  LEFT: - There is no evidence of deep vein thrombosis in the lower extremity.  - No cystic structure found in the popliteal fossa.  *See table(s) above for measurements and observations.    Preliminary    ECHOCARDIOGRAM COMPLETE Result Date: 01/22/2024    ECHOCARDIOGRAM REPORT   Patient Name:  Amanda Meza Date of Exam: 01/22/2024 Medical Rec #:  [id number]             Height:       64.0 in Accession #:    [id number]            Weight:       176.0 lb Date of Birth:  04/01/1991             BSA:          1.853 m Patient Age:    33 years              BP:           118/68 mmHg Patient Gender: F                     HR:           90 bpm. Exam Location:  Inpatient Procedure: 2D Echo, Cardiac Doppler and Color Doppler (Both Spectral and Color            Flow Doppler were utilized during procedure). Indications:    Stroke I63.9  History:        Patient has no prior history of Echocardiogram examinations.                 Risk Factors:Hypertension and Diabetes.  Sonographer:    BERNARDA ROCKS Referring Phys: [id number] ASHISH ARORA IMPRESSIONS  1. Left ventricular ejection fraction, by estimation, is 65 to 70%.  The left ventricle has normal function. The left ventricle has no regional wall motion abnormalities. Left ventricular diastolic parameters were normal.  2. Right ventricular systolic function is normal. The right ventricular size is normal.  3. There is no evidence of cardiac tamponade.  4. The mitral valve is grossly normal. No evidence of mitral valve regurgitation. No evidence of mitral stenosis.  5. The aortic valve is tricuspid. Aortic valve regurgitation is not visualized. No aortic stenosis is present.  6. The inferior vena cava is normal in size with greater than 50% respiratory variability, suggesting right atrial pressure of 3 mmHg. Comparison(s): No prior Echocardiogram. Conclusion(s)/Recommendation(s): No intracardiac source of embolism detected on this transthoracic study. Consider a transesophageal echocardiogram to exclude cardiac source of embolism if clinically indicated. FINDINGS  Left Ventricle: Left ventricular ejection fraction, by estimation, is 65 to 70%. The left ventricle has normal function. The left ventricle has no regional wall motion abnormalities. The left ventricular internal cavity size was normal in size. There is  no left ventricular hypertrophy. Left ventricular diastolic parameters were normal. Right Ventricle: The right ventricular size is normal. No increase in right ventricular wall thickness. Right ventricular systolic function is normal. Left Atrium: Left atrial size was normal in size. Right Atrium: Right atrial size was normal in size. Pericardium: Trivial pericardial effusion is present. The pericardial effusion is posterior to the left ventricle and localized near the right atrium. There is no evidence of cardiac tamponade. Mitral Valve: The mitral valve is grossly normal. No evidence of mitral valve regurgitation. No evidence of mitral valve stenosis. MV peak gradient, 3.9 mmHg. The mean mitral valve gradient is 2.0 mmHg. Tricuspid Valve: The tricuspid valve is normal  in structure. Tricuspid valve regurgitation is not demonstrated. No evidence of tricuspid stenosis. Aortic Valve: The aortic valve is tricuspid. Aortic valve regurgitation is not visualized. No aortic stenosis is present. Aortic valve mean gradient measures 5.0 mmHg. Aortic valve peak gradient measures 11.2 mmHg. Aortic valve area, by  VTI measures 2.62  cm. Pulmonic Valve: The pulmonic valve was normal in structure. Pulmonic valve regurgitation is not visualized. No evidence of pulmonic stenosis. Aorta: The aortic root and ascending aorta are structurally normal, with no evidence of dilitation. Venous: The inferior vena cava is normal in size with greater than 50% respiratory variability, suggesting right atrial pressure of 3 mmHg. IAS/Shunts: The atrial septum is grossly normal.  LEFT VENTRICLE PLAX 2D LVIDd:         4.80 cm      Diastology LVIDs:         3.30 cm      LV e' medial:    7.07 cm/s LV PW:         1.10 cm      LV E/e' medial:  12.1 LV IVS:        1.10 cm      LV e' lateral:   7.40 cm/s LVOT diam:     2.00 cm      LV E/e' lateral: 11.6 LV SV:         74 LV SV Index:   40 LVOT Area:     3.14 cm  LV Volumes (MOD) LV vol d, MOD A2C: 123.0 ml LV vol d, MOD A4C: 144.0 ml LV vol s, MOD A2C: 45.5 ml LV vol s, MOD A4C: 60.8 ml LV SV MOD A2C:     77.5 ml LV SV MOD A4C:     144.0 ml LV SV MOD BP:      80.5 ml RIGHT VENTRICLE             IVC RV Basal diam:  3.10 cm     IVC diam: 1.60 cm RV S prime:     14.20 cm/s TAPSE (M-mode): 2.4 cm LEFT ATRIUM             Index        RIGHT ATRIUM          Index LA diam:        3.50 cm 1.89 cm/m   RA Area:     7.84 cm LA Vol (A2C):   71.4 ml 38.53 ml/m  RA Volume:   12.30 ml 6.64 ml/m LA Vol (A4C):   40.1 ml 21.64 ml/m LA Biplane Vol: 54.5 ml 29.41 ml/m  AORTIC VALVE                     PULMONIC VALVE AV Area (Vmax):    2.86 cm      PV Vmax:          1.41 m/s AV Area (Vmean):   2.55 cm      PV Peak grad:     8.0 mmHg AV Area (VTI):     2.62 cm      PR End Diast Vel:  3.47 msec AV Vmax:           167.00 cm/s AV Vmean:          101.000 cm/s AV VTI:            0.283 m AV Peak Grad:      11.2 mmHg AV Mean Grad:      5.0 mmHg LVOT Vmax:         152.00 cm/s LVOT Vmean:        82.100 cm/s LVOT VTI:          0.236 m LVOT/AV VTI ratio: 0.83  AORTA Ao Root diam: 2.50 cm  Ao Asc diam:  2.90 cm MITRAL VALVE MV Area (PHT): 6.02 cm     SHUNTS MV Area VTI:   3.48 cm     Systemic VTI:  0.24 m MV Peak grad:  3.9 mmHg     Systemic Diam: 2.00 cm MV Mean grad:  2.0 mmHg MV Vmax:       0.99 m/s MV Vmean:      62.7 cm/s MV Decel Time: 126 msec MV E velocity: 85.90 cm/s MV A velocity: 101.00 cm/s MV E/A ratio:  0.85 Sunit Tolia Electronically signed by Madonna Large Signature Date/Time: 01/22/2024/12:43:23 PM    Final    MR Brain W and Wo Contrast Result Date: 01/22/2024 EXAM: MRI BRAIN WITH AND WITHOUT CONTRAST 01/21/2024 11:35:00 PM TECHNIQUE: Multiplanar multisequence MRI of the head/brain was performed with and without the administration of intravenous contrast. COMPARISON: None available. CLINICAL HISTORY: Neuro deficit, acute, stroke suspected. FINDINGS: BRAIN AND VENTRICLES: Moderately large area of acute ischemia within the posterior left MCA territory. Small areas of ischemia within the left basal ganglia and left cerebral peduncle are also noted. Petechial blood products within the infarcted territory but no hemorrhagic transformation. Multifocal hyperintense T2-weighted signal within the cerebral white matter, most commonly due to chronic small vessel disease. There is leptomeningeal contrast enhancement within the infarcted territory which is an expected finding in the setting of subacute ischemia. No mass effect or midline shift. No hydrocephalus. Normal flow voids. No mass. ORBITS: No acute abnormality. SINUSES: No acute abnormality. BONES AND SOFT TISSUES: Normal bone marrow signal and enhancement. No acute soft tissue abnormality. IMPRESSION: 1. Moderately large area of acute ischemia  within the posterior left MCA territory, with associated petechial blood products but no hemorrhagic transformation. 2. Leptomeningeal contrast enhancement within the infarcted territory, an expected finding in the setting of subacute ischemia. 3. Background multifocal hyperintense T2-weighted signal within the cerebral white matter, most commonly due to chronic small vessel disease. Electronically signed by: Franky Stanford MD 01/22/2024 12:02 AM EDT RP Workstation: [id number]   CT ANGIO HEAD NECK W WO CM Result Date: 01/21/2024 EXAM: CTA Head and Neck without and with Intravenous Contrast. CT Head without Contrast. CLINICAL HISTORY: Stroke/TIA, determine embolic source. CT ANGIO HEAD NECK W WO CM; Stroke/TIA, determine embolic source TECHNIQUE: Axial CTA images of the head and neck performed with intravenous contrast. MIP reconstructed images were created and reviewed. Axial computed tomography images of the head/brain performed without intravenous contrast. Note: Per PQRS, the description of internal carotid artery percent stenosis, including 0 percent or normal exam, is based on [place] American Symptomatic Carotid Endarterectomy Trial (NASCET) criteria. Dose reduction technique was used including one or more of the following: automated exposure control, adjustment of mA and kV according to patient size, and/or iterative reconstruction. CONTRAST: 75mL (iohexol  (OMNIPAQUE ) 350 MG/ML injection 75 mL IOHEXOL  350 MG/ML SOLN) COMPARISON: None provided. FINDINGS: CT HEAD: BRAIN: Acute / early subacute infarct within the posterior left MCA territory. VENTRICLES: No hydrocephalus. ORBITS: The orbits are unremarkable. SINUSES AND MASTOIDS: The paranasal sinuses and mastoid air cells are clear. CTA NECK: COMMON CAROTID ARTERIES: No significant stenosis. No dissection or occlusion. INTERNAL CAROTID ARTERIES: No stenosis by NASCET criteria. No dissection or occlusion. VERTEBRAL ARTERIES: No significant stenosis. No dissection  or occlusion. CTA HEAD: ANTERIOR CEREBRAL ARTERIES: No significant stenosis. No occlusion. No aneurysm. MIDDLE CEREBRAL ARTERIES: There is a short segment (4mm) occlusion of the proximal M1 segment of the left middle cerebral artery. POSTERIOR CEREBRAL ARTERIES: No significant stenosis. No  occlusion. No aneurysm. BASILAR ARTERY: No significant stenosis. No occlusion. No aneurysm. OTHER: SOFT TISSUES: No acute finding. No masses or lymphadenopathy. BONES: No acute osseous abnormality. IMPRESSION: 1. Acute/early subacute infarct within the posterior left MCA territory. 2. Short segment occlusion of the proximal M1 segment of the left middle cerebral artery. 3. Findings communicated to Dr. Ashish Arora at 10:48 pm 01/21/2024 Electronically signed by: Franky Stanford MD 01/21/2024 10:50 PM EDT RP Workstation: [id number]   CT Head Wo Contrast Result Date: 01/21/2024 CLINICAL DATA:  Neuro deficit, acute, stroke suspected RUE weakness EXAM: CT HEAD WITHOUT CONTRAST TECHNIQUE: Contiguous axial images were obtained from the base of the skull through the vertex without intravenous contrast. RADIATION DOSE REDUCTION: This exam was performed according to the departmental dose-optimization program which includes automated exposure control, adjustment of the mA and/or kV according to patient size and/or use of iterative reconstruction technique. COMPARISON:  None Available. FINDINGS: Brain: Acute loss of gray-white matter differentiation centered along the left parietal region extending to the left frontal, occipital, temporal lobes. No parenchymal hemorrhage. No mass lesion. No extra-axial collection. Edema along the infarction with loss of sulci. No midline shift. No hydrocephalus. Basilar cisterns are patent. Vascular: No hyperdense vessel. Skull: No acute fracture or focal lesion. Sinuses/Orbits: Paranasal sinuses and mastoid air cells are clear. The orbits are unremarkable. Other: None. IMPRESSION: 1. Acute left posterior MCA  distrubution infarction. 2. No acute intracranial hemorrhage. These results were called by telephone at the time of interpretation on 01/21/2024 at 9:22 pm to provider [facility] , who verbally acknowledged these results. Electronically Signed   By: Morgane  Naveau M.D.   On: 01/21/2024 21:24     Labs:   Basic Metabolic Panel: Recent Labs  Lab 01/21/24 1746 01/21/24 1755 01/22/24 0554  NA 135 138 135  K 3.5 3.6 3.3*  CL 102 105 101  CO2 21*  --  23  GLUCOSE 99 99 114*  BUN 9 8 8   CREATININE 0.78 0.80 0.71  CALCIUM  8.9  --  8.8*  MG 1.9  --   --    GFR Estimated Creatinine Clearance: 102.2 mL/min (by C-G formula based on SCr of 0.71 mg/dL). Liver Function Tests: No results for input(s): AST, ALT, ALKPHOS, BILITOT, PROT, ALBUMIN in the last 168 hours. No results for input(s): LIPASE, AMYLASE in the last 168 hours. No results for input(s): AMMONIA in the last 168 hours. Coagulation profile No results for input(s): INR, PROTIME in the last 168 hours.  CBC: Recent Labs  Lab 01/21/24 1746 01/21/24 1755 01/22/24 0554  WBC 6.5  --  6.3  NEUTROABS 4.1  --   --   HGB 9.7* 11.2* 9.3*  HCT 32.1* 33.0* 29.6*  MCV 81.5  --  78.3*  PLT 418*  --  364   Cardiac Enzymes: No results for input(s): CKTOTAL, CKMB, CKMBINDEX, TROPONINI in the last 168 hours. BNP: Invalid input(s): POCBNP CBG: No results for input(s): GLUCAP in the last 168 hours. D-Dimer No results for input(s): DDIMER in the last 72 hours. Hgb A1c Recent Labs    01/21/24 2259  HGBA1C 5.4   Lipid Profile Recent Labs    01/22/24 0554  CHOL 205*  HDL 63  LDLCALC 131*  TRIG 54  CHOLHDL 3.3   Thyroid function studies Recent Labs    01/22/24 0554  TSH 1.253   Anemia work up Recent Labs    01/22/24 1700  VITAMINB12 219   Microbiology No results found for this or any  previous visit (from the past 240 hours).   Discharge Instructions:   Discharge  Instructions     Ambulatory referral to Neurology   Complete by: As directed    Follow up with Dr. Rosemarie at [facility] in 3-4 weeks. Evaluation for stroke in young pt. Thanks.   Ambulatory referral to Occupational Therapy   Complete by: As directed    Ambulatory referral to Physical Therapy   Complete by: As directed    Ambulatory referral to Speech Therapy   Complete by: As directed    Diet - low sodium heart healthy   Complete by: As directed    Discharge instructions   Complete by: As directed    Follow-up with your primary care provider in 1 week.  Check blood work at that time.  Check your blood pressure everyday and make a log to present it to your primary care provider in the next visit. Continue to take all the medications as prescribed .Continue lifestyle modifications at home.  Will need outpatient echocardiogram with cardiology office( office to schedule an appointment).  Follow-up with neurology office in 3 to 4 weeks.  Office to schedule an appointment.   Increase activity slowly   Complete by: As directed       Allergies as of 01/24/2024       Reactions   Penicillins Rash   Has patient had a PCN reaction causing immediate rash, facial/tongue/throat swelling, SOB or lightheadedness with hypotension: Yes Has patient had a PCN reaction causing severe rash involving mucus membranes or skin necrosis: Yes Has patient had a PCN reaction that required hospitalization: No Has patient had a PCN reaction occurring within the last 10 years: No If all of the above answers are NO, then may proceed with Cephalosporin use.        Medication List     TAKE these medications    amLODipine  10 MG tablet Commonly known as: NORVASC  Take 1 tablet (10 mg total) by mouth daily.   aspirin  EC 81 MG tablet Take 1 tablet (81 mg total) by mouth daily. Swallow whole.   carvedilol  12.5 MG tablet Commonly known as: COREG  Take 1 tablet (12.5 mg total) by mouth 2 (two) times daily with a meal.    clopidogrel  75 MG tablet Commonly known as: PLAVIX  Take 1 tablet (75 mg total) by mouth daily.   losartan  100 MG tablet Commonly known as: COZAAR  Take 1 tablet (100 mg total) by mouth every evening.   rosuvastatin  20 MG tablet Commonly known as: CRESTOR  Take 1 tablet (20 mg total) by mouth daily.        Follow-up Information     [facility]. Schedule an appointment as soon as possible for a visit.   Specialty: Rehabilitation Contact information: [address] [place]  [address] [phone]        [facility] [place] Follow up on 02/12/2024.   Why: Your appointment is at 10:40 am. Please arrive early and bring a picture ID and your current medications. Contact information: [id number], [place], [place] [address] Phone: [phone]        [facility] Follow up.   Why: Use this pharmacy for assistance with the cost of your medications Contact information: [address], [place], [place] [address] Phone: [phone]        Rosemarie Eather RAMAN, MD. Schedule an appointment as soon as possible for a visit in 1 month(s).   Specialties: Neurology, Radiology Why: stroke  clinic Contact information: [address] [place] [address] [phone]                  Time coordinating discharge: 39 minutes  Signed:  Jamile Rekowski  [facility] 01/24/2024, 2:58 PM

## 2024-01-26 LAB — PROTEIN C, TOTAL: Protein C, Total: 116 % (ref 60–150)

## 2024-01-27 LAB — FACTOR 5 LEIDEN

## 2024-01-27 LAB — PROTHROMBIN GENE MUTATION

## 2024-01-28 NOTE — Progress Notes (Signed)
 Cardiology Office Note   Date:  02/03/2024  ID:  [name], DOB April 24, 1991, MRN [id number] PCP: Patient, No Pcp Per  [facility] Providers Cardiologist:  None   History of Present Illness Amanda Meza is a 33 y.o. female with a past medical history of HTN, recent CVA. Patient presents today to establish care and arrange TEE   Patient had been admitted 9/4-01/24/24. She had presented complaining of difficultly moving her hand for weeks. In the ED, her BP was elevated to 233/129. She was found to have an acute left MCA infarct. Neurology was consulted and patient was admitted for further care. She was started on losartan , norvasc , plavix , aspirin , crestor . Echocardiogram 01/22/24 showed EF 65-70%, no regional wall motion abnormalities, normal diastolic parameters, normal RV systolic function, no significant valvular abnormalities.   On interview, patient reports that she has been doing OK since being discharged from the hospital. She notes that she has been having a lot of anxiety around her health and is having a hard time processing her recent stroke. She denies chest pain, palpitations, shortness of breath. She denies headaches, vision changes. She has been checking her BP at home and reports it has been in the 130s-140s/80s. Has a history of high blood pressure and recalls requiring admission for HTN multiple times during her pregnancy in 2019. BP is elevated in clinic today, initially 192/114. After resting a few minutes, improved to 164/110. She admits to being very nervous today and believes that is what's driving her elevated BP. Currently asymptomatic without headache, chest pain, shortness of breath, vision changes, new weakness.   Studies Reviewed Cardiac Studies & Procedures   ______________________________________________________________________________________________     ECHOCARDIOGRAM  ECHOCARDIOGRAM COMPLETE 01/22/2024  Narrative ECHOCARDIOGRAM  REPORT    Patient Name:   Amanda Meza Date of Exam: 01/22/2024 Medical Rec #:  [id number]             Height:       64.0 in Accession #:    [id number]            Weight:       176.0 lb Date of Birth:  02-Mar-1991             BSA:          1.853 m Patient Age:    33 years              BP:           118/68 mmHg Patient Gender: F                     HR:           90 bpm. Exam Location:  Inpatient  Procedure: 2D Echo, Cardiac Doppler and Color Doppler (Both Spectral and Color Flow Doppler were utilized during procedure).  Indications:    Stroke I63.9  History:        Patient has no prior history of Echocardiogram examinations. Risk Factors:Hypertension and Diabetes.  Sonographer:    BERNARDA ROCKS Referring Phys: [id number] ASHISH ARORA  IMPRESSIONS   1. Left ventricular ejection fraction, by estimation, is 65 to 70%. The left ventricle has normal function. The left ventricle has no regional wall motion abnormalities. Left ventricular diastolic parameters were normal. 2. Right ventricular systolic function is normal. The right ventricular size is normal. 3. There is no evidence of cardiac tamponade. 4. The mitral valve is grossly normal. No evidence of mitral valve regurgitation. No evidence  of mitral stenosis. 5. The aortic valve is tricuspid. Aortic valve regurgitation is not visualized. No aortic stenosis is present. 6. The inferior vena cava is normal in size with greater than 50% respiratory variability, suggesting right atrial pressure of 3 mmHg.  Comparison(s): No prior Echocardiogram.  Conclusion(s)/Recommendation(s): No intracardiac source of embolism detected on this transthoracic study. Consider a transesophageal echocardiogram to exclude cardiac source of embolism if clinically indicated.  FINDINGS Left Ventricle: Left ventricular ejection fraction, by estimation, is 65 to 70%. The left ventricle has normal function. The left ventricle has no regional wall motion  abnormalities. The left ventricular internal cavity size was normal in size. There is no left ventricular hypertrophy. Left ventricular diastolic parameters were normal.  Right Ventricle: The right ventricular size is normal. No increase in right ventricular wall thickness. Right ventricular systolic function is normal.  Left Atrium: Left atrial size was normal in size.  Right Atrium: Right atrial size was normal in size.  Pericardium: Trivial pericardial effusion is present. The pericardial effusion is posterior to the left ventricle and localized near the right atrium. There is no evidence of cardiac tamponade.  Mitral Valve: The mitral valve is grossly normal. No evidence of mitral valve regurgitation. No evidence of mitral valve stenosis. MV peak gradient, 3.9 mmHg. The mean mitral valve gradient is 2.0 mmHg.  Tricuspid Valve: The tricuspid valve is normal in structure. Tricuspid valve regurgitation is not demonstrated. No evidence of tricuspid stenosis.  Aortic Valve: The aortic valve is tricuspid. Aortic valve regurgitation is not visualized. No aortic stenosis is present. Aortic valve mean gradient measures 5.0 mmHg. Aortic valve peak gradient measures 11.2 mmHg. Aortic valve area, by VTI measures 2.62 cm.  Pulmonic Valve: The pulmonic valve was normal in structure. Pulmonic valve regurgitation is not visualized. No evidence of pulmonic stenosis.  Aorta: The aortic root and ascending aorta are structurally normal, with no evidence of dilitation.  Venous: The inferior vena cava is normal in size with greater than 50% respiratory variability, suggesting right atrial pressure of 3 mmHg.  IAS/Shunts: The atrial septum is grossly normal.   LEFT VENTRICLE PLAX 2D LVIDd:         4.80 cm      Diastology LVIDs:         3.30 cm      LV e' medial:    7.07 cm/s LV PW:         1.10 cm      LV E/e' medial:  12.1 LV IVS:        1.10 cm      LV e' lateral:   7.40 cm/s LVOT diam:     2.00 cm       LV E/e' lateral: 11.6 LV SV:         74 LV SV Index:   40 LVOT Area:     3.14 cm  LV Volumes (MOD) LV vol d, MOD A2C: 123.0 ml LV vol d, MOD A4C: 144.0 ml LV vol s, MOD A2C: 45.5 ml LV vol s, MOD A4C: 60.8 ml LV SV MOD A2C:     77.5 ml LV SV MOD A4C:     144.0 ml LV SV MOD BP:      80.5 ml  RIGHT VENTRICLE             IVC RV Basal diam:  3.10 cm     IVC diam: 1.60 cm RV S prime:     14.20 cm/s TAPSE (M-mode): 2.4 cm  LEFT ATRIUM             Index        RIGHT ATRIUM          Index LA diam:        3.50 cm 1.89 cm/m   RA Area:     7.84 cm LA Vol (A2C):   71.4 ml 38.53 ml/m  RA Volume:   12.30 ml 6.64 ml/m LA Vol (A4C):   40.1 ml 21.64 ml/m LA Biplane Vol: 54.5 ml 29.41 ml/m AORTIC VALVE                     PULMONIC VALVE AV Area (Vmax):    2.86 cm      PV Vmax:          1.41 m/s AV Area (Vmean):   2.55 cm      PV Peak grad:     8.0 mmHg AV Area (VTI):     2.62 cm      PR End Diast Vel: 3.47 msec AV Vmax:           167.00 cm/s AV Vmean:          101.000 cm/s AV VTI:            0.283 m AV Peak Grad:      11.2 mmHg AV Mean Grad:      5.0 mmHg LVOT Vmax:         152.00 cm/s LVOT Vmean:        82.100 cm/s LVOT VTI:          0.236 m LVOT/AV VTI ratio: 0.83  AORTA Ao Root diam: 2.50 cm Ao Asc diam:  2.90 cm  MITRAL VALVE MV Area (PHT): 6.02 cm     SHUNTS MV Area VTI:   3.48 cm     Systemic VTI:  0.24 m MV Peak grad:  3.9 mmHg     Systemic Diam: 2.00 cm MV Mean grad:  2.0 mmHg MV Vmax:       0.99 m/s MV Vmean:      62.7 cm/s MV Decel Time: 126 msec MV E velocity: 85.90 cm/s MV A velocity: 101.00 cm/s MV E/A ratio:  0.85  Sunit Tolia Electronically signed by Madonna Large Signature Date/Time: 01/22/2024/12:43:23 PM    Final          ______________________________________________________________________________________________       Risk Assessment/Calculations   HYPERTENSION CONTROL Vitals:   02/03/24 0751 02/03/24 0830  BP: (!) 192/114 (!)  164/110    The patient's blood pressure is elevated above target today.  In order to address the patient's elevated BP: The blood pressure is usually elevated in clinic.  Blood pressures monitored at home have been optimal.; A current anti-hypertensive medication was adjusted today.          Physical Exam VS:  BP (!) 164/110   Pulse 93   Ht 5' 4 (1.626 m)   Wt 149 lb 12.8 oz (67.9 kg)   SpO2 99%   BMI 25.71 kg/m        Wt Readings from Last 3 Encounters:  02/03/24 149 lb 12.8 oz (67.9 kg)  01/21/24 176 lb (79.8 kg)  10/20/17 176 lb (79.8 kg)    GEN: Well nourished, well developed in no acute distress. Sitting comfortably on the exam table  NECK: No JVD  CARDIAC:  RRR, no murmurs, rubs, gallops RESPIRATORY:  Clear to auscultation without rales, wheezing or rhonchi.  Normal WOB   ABDOMEN: Soft,non-distended EXTREMITIES:  No edema in BLE; No deformity   ASSESSMENT AND PLAN  Recent CVA  - Diagnosed with an acute left MCA infarct earlier this month  - Patient reports overall doing well since her admission, though she does have a lot of anxiety surrounding her health and recent CVA. Having a hard time processing her diagnosis. Recommended she discuss this anxiety with her PCP (scheduled to establish care later this month)  - CTA head/neck on 9/4 did not show any stenosis in the carotid arteries  - Discussed reassuring echocardiogram- Echocardiogram 9/5 showed EF 65-70%, no regional wall motion abnormalities, normal diastolic parameters, normal RV systolic function, no significant valvular abnormalities - Ordered 30 day event monitor to rule out atrial fibrillation  - Ordered TEE - Ordered BMP, CBC prior to TEE  - Continue ASA 81 mg daily, plavix  75 mg daily - Continue crestor  20 mg daily   HTN  Possible [place] HTN  - BP elevated in clinic today to 164/110. She has been checking her BP at home and reports that it is usually in the 130s-140s/80s. She admits to getting very  anxious around anything healthcare related. Suspect there is a component of [place] HTN, but home BP has also not quite been at goal  - Patient denies any chest pain, shortness of breath, headaches, vision changes, new weakness.  - Increase carvedilol  to 25 mg BID  - Continue amlodipine  10 mg daily, losartan  100 mg daily  - Instructed patient to keep a BP log at home and arranged close follow up. In the future, patient will need renal artery ultrasounds and renin-aldosterone ratios. Did not order today as patient already overwhelmed by her recent CVA  - Ordered BMP   HLD  - Lipid panel from 01/2024 showed LDL 131  - Continue crestor  20 mg daily  - Needs Lipid panel, LFTs  at next visit in 3 weeks   Informed Consent   Shared Decision Making/Informed Consent{  The risks [esophageal damage, perforation (1:10,000 risk), bleeding, pharyngeal hematoma as well as other potential complications associated with conscious sedation including aspiration, arrhythmia, respiratory failure and death], benefits (treatment guidance and diagnostic support) and alternatives of a transesophageal echocardiogram were discussed in detail with Ms. Willemsen and she is willing to proceed.      Dispo: Follow up in 3 weeks with APP to review BP log and adjust medications as needed. Plan to have patient establish with Dr. Raford long term for HTN   Signed, Rollo FABIENE Louder, PA-C

## 2024-02-03 ENCOUNTER — Ambulatory Visit: Payer: MEDICAID | Attending: Cardiology | Admitting: Cardiology

## 2024-02-03 ENCOUNTER — Encounter: Payer: Self-pay | Admitting: Cardiology

## 2024-02-03 ENCOUNTER — Other Ambulatory Visit (HOSPITAL_COMMUNITY): Payer: Self-pay

## 2024-02-03 ENCOUNTER — Telehealth: Payer: Self-pay | Admitting: Licensed Clinical Social Worker

## 2024-02-03 ENCOUNTER — Other Ambulatory Visit: Payer: Self-pay | Admitting: Cardiology

## 2024-02-03 VITALS — BP 164/110 | HR 93 | Ht 64.0 in | Wt 149.8 lb

## 2024-02-03 DIAGNOSIS — I639 Cerebral infarction, unspecified: Secondary | ICD-10-CM

## 2024-02-03 DIAGNOSIS — E782 Mixed hyperlipidemia: Secondary | ICD-10-CM

## 2024-02-03 DIAGNOSIS — I10 Essential (primary) hypertension: Secondary | ICD-10-CM

## 2024-02-03 LAB — BASIC METABOLIC PANEL WITH GFR
BUN/Creatinine Ratio: 9 (ref 9–23)
BUN: 7 mg/dL (ref 6–20)
CO2: 21 mmol/L (ref 20–29)
Calcium: 9.5 mg/dL (ref 8.7–10.2)
Chloride: 101 mmol/L (ref 96–106)
Creatinine, Ser: 0.82 mg/dL (ref 0.57–1.00)
Glucose: 112 mg/dL — ABNORMAL HIGH (ref 70–99)
Potassium: 4.3 mmol/L (ref 3.5–5.2)
Sodium: 138 mmol/L (ref 134–144)
eGFR: 97 mL/min/1.73 (ref >59)

## 2024-02-03 MED ORDER — CARVEDILOL 25 MG PO TABS
25.0000 mg | ORAL_TABLET | Freq: Two times a day (BID) | ORAL | 3 refills | Status: DC
  Filled 2024-02-03: qty 180, 90d supply, fill #0
  Filled 2024-04-18: qty 180, 90d supply, fill #1

## 2024-02-03 NOTE — Telephone Encounter (Signed)
 H&V Care Navigation CSW Progress Note  Clinical Social Worker contacted patient by phone to f/u after appt- left vm at [phone]. Pt Medicaid pending, completed account review, looks like she is missing paperwork from Medicaid.   Patient is participating in a Managed Medicaid Plan:  No, self pay only, Medicaid pending missing documents  SDOH Screenings   Food Insecurity: No Food Insecurity (01/22/2024)  Housing: Low Risk  (01/22/2024)  Transportation Needs: No Transportation Needs (01/22/2024)  Utilities: Not At Risk (01/22/2024)  Tobacco Use: Low Risk  (02/03/2024)    Amanda Meza, MSW, LCSW Clinical Social Worker II [facility] Heart/Vascular Care Navigation  [phone]- work cell phone (preferred)

## 2024-02-03 NOTE — Progress Notes (Signed)
 Heart and Vascular Care Navigation  02/03/2024  Amanda Meza [birth year] [id number]  Reason for Referral: self pay   Engaged with patient by telephone for initial visit for Heart and Vascular Care Coordination.                                                                                                   Assessment:                                     LCSW received a call back from pt today [phone]). Introduced self, role, reason for call. Confirmed home address, DOB, and emergency contact is her father. She states she resides with her daughter and nobody else (per hospital notes she had stated she lives with father of child and other family but denies today). She uses uber for transportation, works part time at [employer] in [place], currently her income is only about $50 more than her rent each month. She inquires about short term disability and her Medicaid application.    LCSW shared that financial counseling has been trying to reach her to complete needed forms so that they can complete/submit her Medicaid application. Believe the assigned caseworker is Amanda Meza, provided pt with her contact information and encouraged her to monitor her calls/voicemails. Right now appears pt has all medications through [facility] program at discharge from hospital, hopefully we can get Medicaid processing before refills needed.   Since pt is part time and she does not appear to be enrolled in any company benefits I shared she may not have any short term disability as she would have needed to self enroll during benefits enrollment and often not open for part time employees. But, encouraged her to speak with whoever manages her payroll to ask if she may be eligible for any assistance or benefits through employer. Cautioned short term disability also usually does not pay at full pay rate if she is eligible. Pt asked then for rent/utility assistance resources.  I will send her those via MyChart  and inquire if she is receiving food stamps as also requested additional food resources.   Otherwise no questions for me at this time, encouraged her to call me as needed.     HRT/VAS Care Coordination     Patients Home Cardiology Office [facility]   Outpatient Care Team Social Worker   Social Worker Name: Marit Lark, [place], [phone]   Living arrangements for the past 2 months Apartment   Lives with: Minor Children   Patient Current Insurance Coverage Medicaid Pending   Patient Has Concern With Paying Medical Bills Yes   Patient Concerns With Medical Bills recent hospital visits and upcoming procedures scheduled   Medical Bill Referrals: Medicaid pending- provided [employer] contact information and if not eligible can assist with CAFA   Does Patient Have Prescription Coverage? No   Home Assistive Devices/Equipment Eyeglasses       Social History:  SDOH Screenings   Food Insecurity: Food Insecurity Present (02/03/2024)  Housing: High Risk (02/03/2024)  Transportation Needs: No Transportation Needs (02/03/2024)  Utilities: Not At Risk (02/03/2024)  Financial Resource Strain: High Risk (02/03/2024)  Tobacco Use: Low Risk  (02/03/2024)  Health Literacy: Adequate Health Literacy (02/03/2024)    SDOH Interventions: Financial Resources:  Surveyor, quantity Strain Interventions: [employer] Provided (Medicaid f/u referral; Environmental manager and resources; rent/utility assistance resources) DSS for financial assistance, Editor, commissioning for [employer], and recommended she speak with employer for any benefits through work  Food Insecurity:  Food Insecurity Interventions: [employer] Provided  Housing Insecurity:  Housing Interventions: Programmer, applications Provided  Transportation:   Transportation Interventions: Intervention Not Indicated    Other Care Navigation  Interventions:     Provided Pharmacy assistance resources  Currently no coverage, has medications from recent hospital stay. Recommended she speak with    Follow-up plan:   LCSW sent pt my contact information, Food resources packet, Out [employer] Garden project sites for the week, and information about rent/utility assistance. Will f/u to ensure Medicaid application can be processed/provide any other resources as able.

## 2024-02-03 NOTE — Addendum Note (Signed)
 Addended by: LENNIE DEFOREST on: 02/03/2024 09:55 AM   Modules accepted: Orders

## 2024-02-03 NOTE — Patient Instructions (Signed)
 Medication Instructions:  INCREASE CARVEDILOL  25 MG TWICE DAILY.  *If you need a refill on your cardiac medications before your next appointment, please call your pharmacy*  Lab Work: TODAY: BMET, CBC If you have labs (blood work) drawn today and your tests are completely normal, you will receive your results only by: MyChart Message (if you have MyChart) OR A paper copy in the mail If you have any lab test that is abnormal or we need to change your treatment, we will call you to review the results.  Testing/Procedures: Your physician has requested that you have a TEE. During a TEE, sound waves are used to create images of your heart. It provides your doctor with information about the size and shape of your heart and how well your heart's chambers and valves are working. In this test, a transducer is attached to the end of a flexible tube that's guided down your throat and into your esophagus (the tube leading from you mouth to your stomach) to get a more detailed image of your heart. You are not awake for the procedure. Please see the instruction sheet given to you today. For further information please visit [email or web address]  Your physician has recommended that you wear an 30 DAY event monitor. Event monitors are medical devices that record the heart's electrical activity. Doctors most often us  these monitors to diagnose arrhythmias. Arrhythmias are problems with the speed or rhythm of the heartbeat. The monitor is a small, portable device. You can wear one while you do your normal daily activities. This is usually used to diagnose what is causing palpitations/syncope (passing out).     Follow-Up: At [facility], you and your health needs are our priority.  As part of our continuing mission to provide you with exceptional heart care, our providers are all part of one team.  This team includes your primary Cardiologist (physician) and Advanced Practice Providers or APPs (Physician  Assistants and Nurse Practitioners) who all work together to provide you with the care you need, when you need it.  Your next appointment:   3 week(s)  Provider:   Rollo Louder, PA-C      We recommend signing up for the patient portal called MyChart.  Sign up information is provided on this After Visit Summary.  MyChart is used to connect with patients for Virtual Visits (Telemedicine).  Patients are able to view lab/test results, encounter notes, upcoming appointments, etc.  Non-urgent messages can be sent to your provider as well.   To learn more about what you can do with MyChart, go to [email or web address].   Other Instructions Please check your blood pressure 2 times per day for 2 weeks and bring readings to next appointment.     Dear Amanda Meza  You are scheduled for a TEE (Transesophageal Echocardiogram) on Monday, September 22 with Dr. Kate.  Please arrive at the [facility] (Main Entrance A) at [facility]: [address] [place], [place] [address] at 11:30 AM (This time is 1 hour(s) before your procedure to ensure your preparation).   Free valet parking service is available. You will check in at ADMITTING.   *Please Note: You will receive a call the day before your procedure to confirm the appointment time. That time may have changed from the original time based on the schedule for that day.*   DIET:  Nothing to eat or drink after midnight except a sip of water with medications (see medication instructions below)  MEDICATION INSTRUCTIONS: MAKE  SURE TO KEEP TAKING ALL OF YOUR MEDICATIONS INCLUDING YOU PLAVIX  AND ASPIRIN    LABS: TODAY  FYI:  For your safety, and to allow us  to monitor your vital signs accurately during the surgery/procedure we request: If you have artificial nails, gel coating, SNS etc, please have those removed prior to your surgery/procedure. Not having the nail coverings /polish removed may result in cancellation or delay of  your surgery/procedure.  Your support person will be asked to wait in the waiting room during your procedure.  It is OK to have someone drop you off and come back when you are ready to be discharged.  You cannot drive after the procedure and will need someone to drive you home.  Bring your insurance cards.  *Special Note: Every effort is made to have your procedure done on time. Occasionally there are emergencies that occur at the hospital that may cause delays. Please be patient if a delay does occur.

## 2024-02-04 ENCOUNTER — Ambulatory Visit: Payer: Self-pay | Admitting: Cardiology

## 2024-02-04 LAB — CBC
Hematocrit: 34.9 % (ref 34.0–46.6)
Hemoglobin: 10.4 g/dL — ABNORMAL LOW (ref 11.1–15.9)
MCH: 24.4 pg — ABNORMAL LOW (ref 26.6–33.0)
MCHC: 29.8 g/dL — ABNORMAL LOW (ref 31.5–35.7)
MCV: 82 fL (ref 79–97)
Platelets: 421 x10E3/uL (ref 150–450)
RBC: 4.26 x10E6/uL (ref 3.77–5.28)
RDW: 16.4 % — ABNORMAL HIGH (ref 11.7–15.4)
WBC: 5.4 x10E3/uL (ref 3.4–10.8)

## 2024-02-05 NOTE — Progress Notes (Signed)
 Called patient with pre-procedure instructions for TEE on Monday. 9/22.   Patient states she does not wish to follow through with the appointment and would like to cancel at this time. Cancel request forwarded to [facility], Cath Lab AD.

## 2024-02-08 ENCOUNTER — Encounter (HOSPITAL_COMMUNITY): Admission: RE | Payer: Self-pay | Source: Home / Self Care

## 2024-02-08 ENCOUNTER — Telehealth (HOSPITAL_BASED_OUTPATIENT_CLINIC_OR_DEPARTMENT_OTHER): Payer: Self-pay | Admitting: Licensed Clinical Social Worker

## 2024-02-08 ENCOUNTER — Ambulatory Visit (HOSPITAL_COMMUNITY): Admission: RE | Admit: 2024-02-08 | Payer: Self-pay | Source: Home / Self Care | Admitting: Cardiology

## 2024-02-08 DIAGNOSIS — I63132 Cerebral infarction due to embolism of left carotid artery: Secondary | ICD-10-CM

## 2024-02-08 DIAGNOSIS — I639 Cerebral infarction, unspecified: Secondary | ICD-10-CM

## 2024-02-08 SURGERY — TRANSESOPHAGEAL ECHOCARDIOGRAM (TEE) (CATHLAB)
Anesthesia: Monitor Anesthesia Care

## 2024-02-08 NOTE — Telephone Encounter (Signed)
 H&V Care Navigation CSW Progress Note  Clinical Social Worker contacted patient by phone to f/u on community resources and f/u with Artist. Was able to reach pt today at [phone]. She confirms she received that she received resources in the mail and via MyChart. She spoke with a Artist but isnt sure of status of her Medicaid application. She is okay with me checking on status, encouraged her to call me if any questions about resources received, reminded her that CAFA and [employer] are for if she does not qualify for Medicaid.   I sent an email securely to [place] and [place] with [employer] team, [place] f/u sharing with me that she could confirm per notes that [place] has completed a Medicaid application and submitted it on behalf of the pt. Will follow for any additional updates.  Patient is participating in a Managed Medicaid Plan:  No, self pay only  SDOH Screenings   Food Insecurity: Food Insecurity Present (02/03/2024)  Housing: High Risk (02/03/2024)  Transportation Needs: No Transportation Needs (02/03/2024)  Utilities: Not At Risk (02/03/2024)  Financial Resource Strain: High Risk (02/03/2024)  Tobacco Use: Low Risk  (02/03/2024)  Health Literacy: Adequate Health Literacy (02/03/2024)    Marit Lark, MSW, LCSW Clinical Social Worker II [facility] Health Heart/Vascular Care Navigation  [phone]- work cell phone (preferred)

## 2024-02-12 ENCOUNTER — Ambulatory Visit: Payer: Self-pay | Admitting: Student in an Organized Health Care Education/Training Program

## 2024-02-15 ENCOUNTER — Encounter: Payer: Self-pay | Admitting: Neurology

## 2024-02-15 ENCOUNTER — Inpatient Hospital Stay: Payer: Self-pay | Admitting: Neurology

## 2024-02-17 ENCOUNTER — Telehealth: Payer: Self-pay | Admitting: Licensed Clinical Social Worker

## 2024-02-17 NOTE — Telephone Encounter (Signed)
 H&V Care Navigation CSW Progress Note  Clinical Social Worker confirmed ptapproved for [employer]. Sent myChart message with this information and encouraged her to reschedule cancelled/missed appointments.   Pt ID is [id number] O .  Patient is participating in a Managed Medicaid Plan:  Yes- UHC  SDOH Screenings   Food Insecurity: Food Insecurity Present (02/03/2024)  Housing: High Risk (02/03/2024)  Transportation Needs: No Transportation Needs (02/03/2024)  Utilities: Not At Risk (02/03/2024)  Financial Resource Strain: High Risk (02/03/2024)  Tobacco Use: Low Risk  (02/03/2024)  Health Literacy: Adequate Health Literacy (02/03/2024)    Marit Lark, MSW, LCSW Clinical Social Worker II [facility] Health Heart/Vascular Care Navigation  [phone]- work cell phone (preferred)

## 2024-02-18 ENCOUNTER — Encounter (HOSPITAL_BASED_OUTPATIENT_CLINIC_OR_DEPARTMENT_OTHER): Payer: Self-pay | Admitting: *Deleted

## 2024-02-18 NOTE — Progress Notes (Signed)
 Mychart message sent to patient, unable to reach via phone (number can not be completed)

## 2024-02-22 ENCOUNTER — Other Ambulatory Visit (HOSPITAL_COMMUNITY): Payer: Self-pay

## 2024-02-29 NOTE — Progress Notes (Signed)
 Cardiology Office Note   Date:  03/11/2024  ID:  Amanda Meza, DOB 10-30-1990, MRN [id number] PCP: Patient, No Pcp Per  [facility] Providers Cardiologist:  Annabella Scarce, MD    History of Present Illness Amanda Meza is a 33 y.o. female  with a past medical history of HTN, recent CVA. Patient presents today for BP management.    Patient had been admitted 9/4-01/24/24. She had presented complaining of difficultly moving her hand for weeks. In the ED, her BP was elevated to 233/129. She was found to have an acute left MCA infarct. Neurology was consulted and patient was admitted for further care. She was started on losartan , norvasc , plavix , aspirin , crestor . Echocardiogram 01/22/24 showed EF 65-70%, no regional wall motion abnormalities, normal diastolic parameters, normal RV systolic function, no significant valvular abnormalities.   I saw patient in follow-up on 9/17.  Her BP was significantly elevated in office.  Had also been elevated at home.  We increased carvedilol  to 25 mg twice daily, remained on amlodipine  and losartan .  Schedule TEE, but patient canceled the procedure.  Today, patient presents for follow-up appointment.  She did not wear the cardiac monitor and had canceled her TEE.  Tells me that she does not want to wear the monitor.  She is frustrated and discouraged by how many doctors appointments she has had to have.  She is very anxious when she goes to the doctor.  Tells me that when she was pregnant with her daughter, she had multiple complications and admissions during her pregnancy.  Since her stroke, she has continued to be somewhat frustrated with how often she needs to be seen in the healthcare system.  Explained that we are trying to get procedures done to better understand why she had a stroke and possibly prevent strokes from happening in the future.  Patient with like to hold off on any more procedures for the meantime.  She tells me that she has  been checking her blood pressure at home.  Reports that her BP is often 120s/130s over 80s at home.  When she checks her blood pressure, she often will talk to her dad for a while and relax before checking.  Notes that she gets very stressed when going into doctors offices.  Here, blood pressure significantly elevated.  She denies chest pain, dizziness, syncope, near syncope.  Denies vision changes.  Denies shortness of breath, headache.  She has been compliant with her medications.  If she takes her medications with food she does not have side effects.  Occasionally has nausea if she takes her medicines on an empty stomach.  Studies Reviewed Cardiac Studies & Procedures   ______________________________________________________________________________________________     ECHOCARDIOGRAM  ECHOCARDIOGRAM COMPLETE 01/22/2024  Narrative ECHOCARDIOGRAM REPORT    Patient Name:   Amanda Meza Date of Exam: 01/22/2024 Medical Rec #:  [id number]             Height:       64.0 in Accession #:    [id number]            Weight:       176.0 lb Date of Birth:  06/13/1990             BSA:          1.853 m Patient Age:    33 years              BP:  118/68 mmHg Patient Gender: F                     HR:           90 bpm. Exam Location:  Inpatient  Procedure: 2D Echo, Cardiac Doppler and Color Doppler (Both Spectral and Color Flow Doppler were utilized during procedure).  Indications:    Stroke I63.9  History:        Patient has no prior history of Echocardiogram examinations. Risk Factors:Hypertension and Diabetes.  Sonographer:    BERNARDA ROCKS Referring Phys: [id number] ASHISH ARORA  IMPRESSIONS   1. Left ventricular ejection fraction, by estimation, is 65 to 70%. The left ventricle has normal function. The left ventricle has no regional wall motion abnormalities. Left ventricular diastolic parameters were normal. 2. Right ventricular systolic function is normal. The right ventricular  size is normal. 3. There is no evidence of cardiac tamponade. 4. The mitral valve is grossly normal. No evidence of mitral valve regurgitation. No evidence of mitral stenosis. 5. The aortic valve is tricuspid. Aortic valve regurgitation is not visualized. No aortic stenosis is present. 6. The inferior vena cava is normal in size with greater than 50% respiratory variability, suggesting right atrial pressure of 3 mmHg.  Comparison(s): No prior Echocardiogram.  Conclusion(s)/Recommendation(s): No intracardiac source of embolism detected on this transthoracic study. Consider a transesophageal echocardiogram to exclude cardiac source of embolism if clinically indicated.  FINDINGS Left Ventricle: Left ventricular ejection fraction, by estimation, is 65 to 70%. The left ventricle has normal function. The left ventricle has no regional wall motion abnormalities. The left ventricular internal cavity size was normal in size. There is no left ventricular hypertrophy. Left ventricular diastolic parameters were normal.  Right Ventricle: The right ventricular size is normal. No increase in right ventricular wall thickness. Right ventricular systolic function is normal.  Left Atrium: Left atrial size was normal in size.  Right Atrium: Right atrial size was normal in size.  Pericardium: Trivial pericardial effusion is present. The pericardial effusion is posterior to the left ventricle and localized near the right atrium. There is no evidence of cardiac tamponade.  Mitral Valve: The mitral valve is grossly normal. No evidence of mitral valve regurgitation. No evidence of mitral valve stenosis. MV peak gradient, 3.9 mmHg. The mean mitral valve gradient is 2.0 mmHg.  Tricuspid Valve: The tricuspid valve is normal in structure. Tricuspid valve regurgitation is not demonstrated. No evidence of tricuspid stenosis.  Aortic Valve: The aortic valve is tricuspid. Aortic valve regurgitation is not visualized. No  aortic stenosis is present. Aortic valve mean gradient measures 5.0 mmHg. Aortic valve peak gradient measures 11.2 mmHg. Aortic valve area, by VTI measures 2.62 cm.  Pulmonic Valve: The pulmonic valve was normal in structure. Pulmonic valve regurgitation is not visualized. No evidence of pulmonic stenosis.  Aorta: The aortic root and ascending aorta are structurally normal, with no evidence of dilitation.  Venous: The inferior vena cava is normal in size with greater than 50% respiratory variability, suggesting right atrial pressure of 3 mmHg.  IAS/Shunts: The atrial septum is grossly normal.   LEFT VENTRICLE PLAX 2D LVIDd:         4.80 cm      Diastology LVIDs:         3.30 cm      LV e' medial:    7.07 cm/s LV PW:         1.10 cm      LV E/e' medial:  12.1 LV IVS:        1.10 cm      LV e' lateral:   7.40 cm/s LVOT diam:     2.00 cm      LV E/e' lateral: 11.6 LV SV:         74 LV SV Index:   40 LVOT Area:     3.14 cm  LV Volumes (MOD) LV vol d, MOD A2C: 123.0 ml LV vol d, MOD A4C: 144.0 ml LV vol s, MOD A2C: 45.5 ml LV vol s, MOD A4C: 60.8 ml LV SV MOD A2C:     77.5 ml LV SV MOD A4C:     144.0 ml LV SV MOD BP:      80.5 ml  RIGHT VENTRICLE             IVC RV Basal diam:  3.10 cm     IVC diam: 1.60 cm RV S prime:     14.20 cm/s TAPSE (M-mode): 2.4 cm  LEFT ATRIUM             Index        RIGHT ATRIUM          Index LA diam:        3.50 cm 1.89 cm/m   RA Area:     7.84 cm LA Vol (A2C):   71.4 ml 38.53 ml/m  RA Volume:   12.30 ml 6.64 ml/m LA Vol (A4C):   40.1 ml 21.64 ml/m LA Biplane Vol: 54.5 ml 29.41 ml/m AORTIC VALVE                     PULMONIC VALVE AV Area (Vmax):    2.86 cm      PV Vmax:          1.41 m/s AV Area (Vmean):   2.55 cm      PV Peak grad:     8.0 mmHg AV Area (VTI):     2.62 cm      PR End Diast Vel: 3.47 msec AV Vmax:           167.00 cm/s AV Vmean:          101.000 cm/s AV VTI:            0.283 m AV Peak Grad:      11.2 mmHg AV Mean  Grad:      5.0 mmHg LVOT Vmax:         152.00 cm/s LVOT Vmean:        82.100 cm/s LVOT VTI:          0.236 m LVOT/AV VTI ratio: 0.83  AORTA Ao Root diam: 2.50 cm Ao Asc diam:  2.90 cm  MITRAL VALVE MV Area (PHT): 6.02 cm     SHUNTS MV Area VTI:   3.48 cm     Systemic VTI:  0.24 m MV Peak grad:  3.9 mmHg     Systemic Diam: 2.00 cm MV Mean grad:  2.0 mmHg MV Vmax:       0.99 m/s MV Vmean:      62.7 cm/s MV Decel Time: 126 msec MV E velocity: 85.90 cm/s MV A velocity: 101.00 cm/s MV E/A ratio:  0.85  Sunit Tolia Electronically signed by Madonna Large Signature Date/Time: 01/22/2024/12:43:23 PM    Final          ______________________________________________________________________________________________      Risk Assessment/Calculations   HYPERTENSION CONTROL Vitals:   03/11/24  1516 03/11/24 1525  BP: (!) 181/124 (!) 173/106    The patient's blood pressure is elevated above target today.  In order to address the patient's elevated BP:           Physical Exam VS:  BP (!) 173/106   Pulse 100   Ht 5' 4 (1.626 m)   Wt 152 lb 3.2 oz (69 kg)   SpO2 100%   BMI 26.13 kg/m        Wt Readings from Last 3 Encounters:  03/11/24 152 lb 3.2 oz (69 kg)  02/03/24 149 lb 12.8 oz (67.9 kg)  01/21/24 176 lb (79.8 kg)    GEN: Well nourished, well developed in no acute distress. Sitting comfortably on the exam table in no acute distress  NECK: No JVD  CARDIAC:  RRR, no murmurs, rubs, gallops. Radial pulses 2+ bilaterally  RESPIRATORY:  Clear to auscultation without rales, wheezing or rhonchi. Normal WOB on room air   ABDOMEN: Soft, non-tender, non-distended EXTREMITIES:  No edema in BLE; No deformity   ASSESSMENT AND PLAN  Recent CVA  - Diagnosed with an acute left MCA infarct in 01/2024 - CTA head/neck on 9/4 did not show any stenosis in the carotid arteries  - Echocardiogram 9/5 showed EF 65-70%, no regional wall motion abnormalities, normal diastolic parameters,  normal RV systolic function, no significant valvular abnormalities - Ordered 30 day monitor and TEE At last appointment, but patient canceled both studies. We discussed these studies more today. She tells me that she does not want to wear a monitor and does not want to have a TEE. She is frustrated and overwhelmed by her recent stroke and is tired of having to see providers. I told patient that we are trying to better understand the cause of her stoke and try to prevent strokes in the future, but she continues to decline further workup  - Patient follows with neurology  - Continue ASA 81 mg daily - Continue crestor  20 mg daily    HTN  Possible [place] HTN  - BP elevated at last clinic appointment to 164/110. Tells me that she checks her BP at home and it is always in the 120s-130s/80s.  - BP 170/98 today.  She gets very anxious when she comes to the office. She had a lot of complications and admissions when she was pregnant with her daughter and continues to have a lot of anxiety surrounding healthcare since that time. She is viably anxious in the office today. She is adamant that BP well controlled at home   - Patient denies any chest pain, shortness of breath, headaches, vision changes, new weakness.  - Arranged advanced HTN clinic appointment for 2-3 weeks. Instructed her to bring her home BP cuff to that appointment to make sure it is accurate - Continue amlodipine  10 mg daily, losartan  100 mg daily, carvedilol  25 mg BID   - Instructed patient to keep a BP log at home and arranged close follow up. In the future, patient will need renal artery ultrasounds and renin-aldosterone ratios. Did not order today as patient already overwhelmed by her recent CVA  - Ordered BMP    HLD  - Lipid panel from 01/2024 showed LDL 131  - Continue crestor  20 mg daily  - Ordered lipids, LFTs    Dispo: Follow up in 2-3 weeks   Signed, Rollo FABIENE Louder, PA-C

## 2024-03-04 NOTE — Progress Notes (Signed)
 Patient scheduled 10/23 .

## 2024-03-07 ENCOUNTER — Encounter (HOSPITAL_BASED_OUTPATIENT_CLINIC_OR_DEPARTMENT_OTHER): Payer: Self-pay

## 2024-03-09 ENCOUNTER — Encounter (HOSPITAL_BASED_OUTPATIENT_CLINIC_OR_DEPARTMENT_OTHER): Payer: Self-pay

## 2024-03-10 ENCOUNTER — Encounter (HOSPITAL_BASED_OUTPATIENT_CLINIC_OR_DEPARTMENT_OTHER): Admitting: Family

## 2024-03-11 ENCOUNTER — Encounter: Payer: Self-pay | Admitting: Cardiology

## 2024-03-11 ENCOUNTER — Ambulatory Visit: Payer: Self-pay | Attending: Cardiology | Admitting: Cardiology

## 2024-03-11 VITALS — BP 173/106 | HR 100 | Ht 64.0 in | Wt 152.2 lb

## 2024-03-11 DIAGNOSIS — I10 Essential (primary) hypertension: Secondary | ICD-10-CM | POA: Insufficient documentation

## 2024-03-11 DIAGNOSIS — E782 Mixed hyperlipidemia: Secondary | ICD-10-CM | POA: Diagnosis not present

## 2024-03-11 DIAGNOSIS — I63132 Cerebral infarction due to embolism of left carotid artery: Secondary | ICD-10-CM | POA: Diagnosis not present

## 2024-03-11 DIAGNOSIS — I639 Cerebral infarction, unspecified: Secondary | ICD-10-CM | POA: Diagnosis not present

## 2024-03-11 NOTE — Patient Instructions (Addendum)
 Medication Instructions:  Your physician recommends that you continue on your current medications as directed. Please refer to the Current Medication list given to you today.  *If you need a refill on your cardiac medications before your next appointment, please call your pharmacy*  Lab Work: Today-Fasting Lipids, CMP If you have labs (blood work) drawn today and your tests are completely normal, you will receive your results only by: MyChart Message (if you have MyChart) OR A paper copy in the mail If you have any lab test that is abnormal or we need to change your treatment, we will call you to review the results.   Follow-Up: At [facility], you and your health needs are our priority.  As part of our continuing mission to provide you with exceptional heart care, our providers are all part of one team.  This team includes your primary Cardiologist (physician) and Advanced Practice Providers or APPs (Physician Assistants and Nurse Practitioners) who all work together to provide you with the care you need, when you need it.  Your next appointment:   2 week(s)  Provider:   Reche Finder, NP    We recommend signing up for the patient portal called MyChart.  Sign up information is provided on this After Visit Summary.  MyChart is used to connect with patients for Virtual Visits (Telemedicine).  Patients are able to view lab/test results, encounter notes, upcoming appointments, etc.  Non-urgent messages can be sent to your provider as well.   To learn more about what you can do with MyChart, go to [email or web address].   Other Instructions Please bring your blood pressure monitor to your next appointment

## 2024-03-12 LAB — COMPREHENSIVE METABOLIC PANEL WITH GFR
ALT: 92 IU/L — ABNORMAL HIGH (ref 0–32)
AST: 90 IU/L — ABNORMAL HIGH (ref 0–40)
Albumin: 4.4 g/dL (ref 3.9–4.9)
Alkaline Phosphatase: 127 IU/L — ABNORMAL HIGH (ref 41–116)
BUN/Creatinine Ratio: 5 — ABNORMAL LOW (ref 9–23)
BUN: 4 mg/dL — ABNORMAL LOW (ref 6–20)
Bilirubin Total: 0.5 mg/dL (ref 0.0–1.2)
CO2: 22 mmol/L (ref 20–29)
Calcium: 9.7 mg/dL (ref 8.7–10.2)
Chloride: 102 mmol/L (ref 96–106)
Creatinine, Ser: 0.78 mg/dL (ref 0.57–1.00)
Globulin, Total: 3.7 g/dL (ref 1.5–4.5)
Glucose: 102 mg/dL — ABNORMAL HIGH (ref 70–99)
Potassium: 4.4 mmol/L (ref 3.5–5.2)
Sodium: 137 mmol/L (ref 134–144)
Total Protein: 8.1 g/dL (ref 6.0–8.5)
eGFR: 103 mL/min/1.73 (ref >59)

## 2024-03-12 LAB — LIPID PANEL
Chol/HDL Ratio: 2.6 ratio (ref 0.0–4.4)
Cholesterol, Total: 153 mg/dL (ref 100–199)
HDL: 59 mg/dL (ref >39)
LDL Chol Calc (NIH): 81 mg/dL (ref 0–99)
Triglycerides: 64 mg/dL (ref 0–149)
VLDL Cholesterol Cal: 13 mg/dL (ref 5–40)

## 2024-03-14 ENCOUNTER — Ambulatory Visit: Payer: Self-pay | Admitting: Cardiology

## 2024-03-17 ENCOUNTER — Other Ambulatory Visit (HOSPITAL_COMMUNITY): Payer: Self-pay

## 2024-03-18 ENCOUNTER — Telehealth: Payer: Self-pay | Admitting: Cardiology

## 2024-03-18 NOTE — Telephone Encounter (Signed)
 Spoke with the patient and advised that documentation of upcoming appointment on 11/11 will be faxed to her employer.

## 2024-03-18 NOTE — Telephone Encounter (Signed)
 Pt would like a c/b regarding documentation of upcoming appt being sent via fax to employer.  Please advise  Fax - [phone]

## 2024-03-29 ENCOUNTER — Ambulatory Visit (HOSPITAL_BASED_OUTPATIENT_CLINIC_OR_DEPARTMENT_OTHER): Admitting: Family

## 2024-04-06 ENCOUNTER — Encounter (HOSPITAL_BASED_OUTPATIENT_CLINIC_OR_DEPARTMENT_OTHER): Payer: Self-pay

## 2024-04-07 ENCOUNTER — Encounter (HOSPITAL_BASED_OUTPATIENT_CLINIC_OR_DEPARTMENT_OTHER): Admitting: Family

## 2024-04-08 ENCOUNTER — Other Ambulatory Visit (HOSPITAL_COMMUNITY): Payer: Self-pay

## 2024-04-18 ENCOUNTER — Telehealth: Payer: Self-pay | Admitting: Cardiology

## 2024-04-18 ENCOUNTER — Other Ambulatory Visit (HOSPITAL_COMMUNITY): Payer: Self-pay

## 2024-04-18 ENCOUNTER — Other Ambulatory Visit (HOSPITAL_BASED_OUTPATIENT_CLINIC_OR_DEPARTMENT_OTHER): Payer: Self-pay

## 2024-04-18 NOTE — Telephone Encounter (Signed)
  1. Which medications need to be refilled? (please list name of each medication and dose if known)             amLODipine  (NORVASC ) 10 MG tablet Take 1 tablet (10 mg total) by mouth daily.   aspirin  EC 81 MG tablet Take 1 tablet (81 mg total) by mouth daily. Swallow whole.   carvedilol  (COREG ) 25 MG tablet Take 1 tablet (25 mg total) by mouth 2 (two) times daily.   clopidogrel  (PLAVIX ) 75 MG tablet Take 1 tablet (75 mg total) by mouth daily.   losartan  (COZAAR ) 100 MG tablet Take 1 tablet (100 mg total) by mouth every evening.   rosuvastatin  (CRESTOR ) 20 MG tablet      2. Would you like to learn more about the convenience, safety, & potential cost savings by using the [facility]? no     3. Are you open to using the [facility] (Type [facility]. no     4. Which pharmacy/location (including street and city if local pharmacy) is medication to be sent to?    [facility] [address] [place], [place] - [address]   5. Do they need a 30 day or 90 day supply? 90 day   Pt is completely out.

## 2024-04-19 NOTE — Telephone Encounter (Signed)
 For Review-the only med I see that we have filled is the carvedilol . Ok to fill the others???

## 2024-04-20 ENCOUNTER — Other Ambulatory Visit (HOSPITAL_BASED_OUTPATIENT_CLINIC_OR_DEPARTMENT_OTHER): Payer: Self-pay

## 2024-04-20 ENCOUNTER — Other Ambulatory Visit (HOSPITAL_COMMUNITY): Payer: Self-pay

## 2024-04-20 MED ORDER — AMLODIPINE BESYLATE 10 MG PO TABS: 10.0000 mg | ORAL_TABLET | Freq: Every day | ORAL | 3 refills | Status: AC

## 2024-04-20 MED ORDER — CLOPIDOGREL BISULFATE 75 MG PO TABS: 75.0000 mg | ORAL_TABLET | Freq: Every day | ORAL | 0 refills | Status: AC

## 2024-04-20 MED ORDER — CARVEDILOL 25 MG PO TABS: 25.0000 mg | ORAL_TABLET | Freq: Two times a day (BID) | ORAL | 3 refills | Status: AC

## 2024-04-20 MED ORDER — LOSARTAN POTASSIUM 100 MG PO TABS: 100.0000 mg | ORAL_TABLET | Freq: Every evening | ORAL | 3 refills | Status: AC

## 2024-04-20 MED ORDER — ASPIRIN 81 MG PO TBEC: 81.0000 mg | DELAYED_RELEASE_TABLET | Freq: Every day | ORAL | 3 refills | Status: AC

## 2024-04-20 MED ORDER — ROSUVASTATIN CALCIUM 20 MG PO TABS: 20.0000 mg | ORAL_TABLET | Freq: Every day | ORAL | 3 refills | Status: AC
